# Patient Record
Sex: Female | Born: 1950 | Race: White | Hispanic: No | Marital: Married
Health system: Midwestern US, Academic
[De-identification: ages and names within clinical notes are randomized; demographics above are authoritative.]

---

## 2017-06-24 ENCOUNTER — Encounter: Admit: 2017-06-24 | Discharge: 2017-06-24 | Payer: BC Managed Care – PPO

## 2017-06-24 DIAGNOSIS — I159 Secondary hypertension, unspecified: Principal | ICD-10-CM

## 2017-06-24 DIAGNOSIS — E785 Hyperlipidemia, unspecified: ICD-10-CM

## 2017-06-24 MED ORDER — LABETALOL 100 MG PO TAB
ORAL_TABLET | Freq: Two times a day (BID) | ORAL | 0 refills | 60.00000 days | Status: AC
Start: 2017-06-24 — End: 2017-08-26

## 2017-07-05 ENCOUNTER — Encounter: Admit: 2017-07-05 | Discharge: 2017-07-05 | Payer: BC Managed Care – PPO

## 2017-07-05 DIAGNOSIS — I159 Secondary hypertension, unspecified: Principal | ICD-10-CM

## 2017-07-05 DIAGNOSIS — E785 Hyperlipidemia, unspecified: ICD-10-CM

## 2017-07-05 MED ORDER — ATORVASTATIN 20 MG PO TAB
ORAL_TABLET | Freq: Every day | 2 refills
Start: 2017-07-05 — End: ?

## 2017-07-25 ENCOUNTER — Encounter: Admit: 2017-07-25 | Discharge: 2017-07-25 | Payer: MEDICARE

## 2017-07-25 DIAGNOSIS — E785 Hyperlipidemia, unspecified: ICD-10-CM

## 2017-07-25 DIAGNOSIS — I159 Secondary hypertension, unspecified: Principal | ICD-10-CM

## 2017-07-26 MED ORDER — LABETALOL 100 MG PO TAB
ORAL_TABLET | Freq: Two times a day (BID) | 0 refills
Start: 2017-07-26 — End: ?

## 2017-08-05 ENCOUNTER — Encounter: Admit: 2017-08-05 | Discharge: 2017-08-05 | Payer: BC Managed Care – PPO

## 2017-08-05 DIAGNOSIS — I159 Secondary hypertension, unspecified: Principal | ICD-10-CM

## 2017-08-05 DIAGNOSIS — E785 Hyperlipidemia, unspecified: ICD-10-CM

## 2017-08-06 MED ORDER — ATORVASTATIN 20 MG PO TAB
ORAL_TABLET | Freq: Every day | 0 refills | Status: AC
Start: 2017-08-06 — End: 2017-08-26

## 2017-08-06 NOTE — Telephone Encounter
Pt overdue for OV and labs. Pt has an upcoming appointment. Pt agreeable to get labs. AST, ALT and Lipid panel labs sent.

## 2017-08-09 ENCOUNTER — Encounter: Admit: 2017-08-09 | Discharge: 2017-08-09 | Payer: MEDICARE

## 2017-08-09 DIAGNOSIS — E785 Hyperlipidemia, unspecified: ICD-10-CM

## 2017-08-09 DIAGNOSIS — I159 Secondary hypertension, unspecified: Principal | ICD-10-CM

## 2017-08-09 LAB — COMPREHENSIVE METABOLIC PANEL
Lab: 0.5
Lab: 16
Lab: 20
Lab: 65
Lab: 9.6

## 2017-08-09 MED ORDER — LABETALOL 100 MG PO TAB
ORAL_TABLET | Freq: Two times a day (BID) | 0 refills
Start: 2017-08-09 — End: ?

## 2017-08-10 ENCOUNTER — Encounter: Admit: 2017-08-10 | Discharge: 2017-08-10 | Payer: MEDICARE

## 2017-08-10 ENCOUNTER — Encounter: Admit: 2017-08-10 | Discharge: 2017-08-10 | Payer: BC Managed Care – PPO

## 2017-08-10 NOTE — Progress Notes
Records Request    Medical records request for continuation of care:    Patient has appointment on 08/26/17   with  Dr. Samantha Crimes* .    Please fax records to [facility]  [phone]    Request records:    Most recent hospital admission/ Emergency Room Visit    EKG's           Recent Cardiac Testing    Any cardiac-related records    Recent Labs    Procedures    H&P/Discharge Summary      Thank you,      [facility]  [facility]  [address] [place] [address]  Phone:  [phone]  Fax:  [phone]

## 2017-08-18 ENCOUNTER — Encounter: Admit: 2017-08-18 | Discharge: 2017-08-18 | Payer: BC Managed Care – PPO

## 2017-08-18 DIAGNOSIS — E785 Hyperlipidemia, unspecified: Principal | ICD-10-CM

## 2017-08-18 LAB — LIPID PROFILE
Lab: 124
Lab: 173

## 2017-08-26 ENCOUNTER — Ambulatory Visit: Admit: 2017-08-26 | Discharge: 2017-08-27 | Payer: BC Managed Care – PPO

## 2017-08-26 ENCOUNTER — Encounter: Admit: 2017-08-26 | Discharge: 2017-08-26 | Payer: MEDICARE

## 2017-08-26 DIAGNOSIS — E785 Hyperlipidemia, unspecified: ICD-10-CM

## 2017-08-26 DIAGNOSIS — I63511 Cerebral infarction due to unspecified occlusion or stenosis of right middle cerebral artery: ICD-10-CM

## 2017-08-26 DIAGNOSIS — Z9189 Other specified personal risk factors, not elsewhere classified: ICD-10-CM

## 2017-08-26 DIAGNOSIS — R51 Headache: ICD-10-CM

## 2017-08-26 DIAGNOSIS — I639 Cerebral infarction, unspecified: ICD-10-CM

## 2017-08-26 DIAGNOSIS — J45909 Unspecified asthma, uncomplicated: Principal | ICD-10-CM

## 2017-08-26 DIAGNOSIS — I10 Essential (primary) hypertension: ICD-10-CM

## 2017-08-26 DIAGNOSIS — I219 Acute myocardial infarction, unspecified: ICD-10-CM

## 2017-08-26 DIAGNOSIS — Z8249 Family history of ischemic heart disease and other diseases of the circulatory system: ICD-10-CM

## 2017-08-26 DIAGNOSIS — G43411 Hemiplegic migraine, intractable, with status migrainosus: ICD-10-CM

## 2017-08-26 DIAGNOSIS — F419 Anxiety disorder, unspecified: ICD-10-CM

## 2017-08-26 DIAGNOSIS — I159 Secondary hypertension, unspecified: Principal | ICD-10-CM

## 2017-08-26 DIAGNOSIS — E669 Obesity, unspecified: ICD-10-CM

## 2017-08-26 DIAGNOSIS — Z720 Tobacco use: ICD-10-CM

## 2017-08-26 DIAGNOSIS — Z8679 Personal history of other diseases of the circulatory system: ICD-10-CM

## 2017-08-26 MED ORDER — LABETALOL 100 MG PO TAB
100 mg | ORAL_TABLET | Freq: Two times a day (BID) | ORAL | 3 refills | 60.00000 days | Status: AC
Start: 2017-08-26 — End: 2018-08-23

## 2017-08-26 MED ORDER — ATORVASTATIN 20 MG PO TAB
20 mg | ORAL_TABLET | Freq: Every day | ORAL | 3 refills | Status: AC
Start: 2017-08-26 — End: ?

## 2017-08-26 NOTE — Progress Notes
Date of Service: 08/26/2017    Suzanne Hopkins is a 66 y.o. female.       HPI     Suzanne Hopkins is a 66 year old white female that has a history of hypertension, hyperlipidemia, risk factors for coronary artery disease, family history of premature CAD and she also has a history of CVA.  She was last seen by me in the office in February 2017.    This patient sustained a cerebrovascular accident in July 2016.  She initially presented at [facility] where she was administered intravenous TPA.  She was then transferred to [facility] for further management.    She recovered well from this event.  For a while she did have left-sided motor deficit and speech related issues but after intense physical therapy and occupational therapy patient did recover the function.    She was admitted and evaluated at [facility] in July 2018.  Patient recalls that at that time she was at an air show and she became very dehydrated.  She was administered IV fluids, her hemodynamic parameters were stable and she was discharged home in stable condition.    Patient does not report having symptoms of chest pain or heart palpitations.  She was evaluated with a myocardial perfusion imaging study on 07/22/2015, the tomographic pattern was negative for ischemia, ejection fraction was normal.    Due to a history of symptomatic heart palpitations she was also evaluated with an event monitor in December 2016, she was found to have isolated PVCs, there were no other abnormalities.         Vitals:    08/26/17 1506 08/26/17 1518   BP: 136/86 142/78   Pulse: 68    Weight: 90 kg (198 lb 6.4 oz)    Height: 1.6 m (5' 3)      Body mass index is 35.14 kg/m???.     Past Medical History  Patient Active Problem List    Diagnosis Date Noted   ??? Obesity (BMI 30.0-34.9) 10/24/2015   ??? Essential hypertension 10/24/2015   ??? History of tobacco abuse 07/09/2015   ??? Cardiovascular risk factor 07/09/2015 ??? Family history of ASCVD (arteriosclerotic cardiovascular disease) 07/09/2015   ??? Cerebral infarct (HCC) 07/01/2015     Hemiplegia, unspecified affecting left nondominate side, treated with tPA       ??? Aphasia 07/01/2015   ??? Hyperlipidemia 07/01/2015   ??? CVA (cerebral vascular accident) (HCC) 07/01/2015     Acute ischemic right MCA stroke, MRI 03/2015 no acute infarct, mild chronic small vessel ischemic disease,        ??? 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 Arterial ischemic stroke, MCA (middle cerebral artery), right, acute (HCC) 07/01/2015     angio neck 03/2015 no stenosis of arteries, scattered mild atherosclerotic plaque. Echo doppler 03/2015 EF 75 % no valvular abnormalities, CXR 03/2015 normal cardiac size, low lung volume, bibasilar heterogeneous airspace opacities correlate ateleasis/and/or edema.      ??? 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 H/O knee surgery 07/01/2015     Has history of 19 surgeries to left knee       ??? H/O: hysterectomy 07/01/2015   ??? Pulmonary nodule 06/17/2015   ??? Tobacco abuse 06/17/2015   ??? Mild intermittent asthma without complication 06/17/2015         Review of Systems  Constitution: Negative.   HENT: Negative.    Eyes: Negative.    Cardiovascular: Negative.    Respiratory: Negative.    Endocrine: Negative.    Hematologic/Lymphatic: Negative.    Skin: Negative.    Musculoskeletal: Negative.    Gastrointestinal: Negative.    Genitourinary: Negative.    Neurological: Negative.    Psychiatric/Behavioral: Negative.    Allergic/Immunologic: Negative.        Physical Exam  General Appearance: obese  Skin: warm, moist, no ulcers or xanthomas  Eyes: conjunctivae and lids normal, pupils are equal and round  Lips & Oral Mucosa: no pallor or cyanosis  Neck Veins: neck veins are flat, neck veins are not distended Chest Inspection: chest is normal in appearance  Respiratory Effort: breathing comfortably, no respiratory distress  Auscultation/Percussion: lungs clear to auscultation, no rales or rhonchi, no wheezing  Cardiac Rhythm: regular rhythm and normal rate  Cardiac Auscultation: S1, S2 normal, no rub, no gallop  Murmurs: no murmur  Carotid Arteries: normal carotid upstroke bilaterally, no bruit  Abdominal aorta: could not be examined due to obese adomen  Lower Extremity Edema: no lower extremity edema  Abdominal Exam: soft, non-tender, no masses, bowel sounds normal  Liver & Spleen: no organomegaly  Language and Memory: patient responsive and seems to comprehend information  Neurologic Exam: neurological assessment grossly intact        Cardiovascular Studies  Twelve-lead EKG demonstrates normal sinus rhythm, early R wave progression    Problems Addressed Today  Encounter Diagnoses   Name Primary?   ??? Cerebrovascular accident (CVA), unspecified mechanism (HCC) Yes   ??? Secondary hypertension    ??? Hyperlipidemia, unspecified hyperlipidemia type    ??? Arterial ischemic stroke, MCA (middle cerebral artery), right, acute (HCC)    ??? Hemiplegic migraine, with intractable migraine, so stated, with status migrainosus    ??? Essential hypertension    ??? Tobacco abuse    ??? Cardiovascular risk factor    ??? Family history of ASCVD (arteriosclerotic cardiovascular disease)    ??? Obesity (BMI 30.0-34.9)        Assessment and Plan     Assessment:    1.  History of CVA  ??? Patient was admitted at [facility] in July 2016  ??? Patient initially presented in [facility] ER, she received intravenous TPA  ??? At the time, the workup did not reveal any obvious etiology of the thrombotic stroke  ??? Patient did have left-sided motor deficit, however with physical and occupational therapy she recovered very well    2.  Hypertension  ??? Patient is on a small dose of labetalol and her blood pressure is under reasonable control 3.  Hyperlipidemia  ??? Patient is on statin therapy    4.  Family history of premature CAD  ??? Patient states that her father sustained an MI  ??? Patient has 2 sisters who underwent PCI, she also has a sister that has an ICD in place  ??? Previous evaluation with a myocardial perfusion imaging study in November 2016 did not reveal any ischemia.    Plan:    1.  Continue all current medications  2.  We did discuss about risk factors modification and in particular weight reduction  3.  Follow-up office visit in 10-12 months.         Current Medications (including today's revisions)  ??? ASPIRIN PO Take 81 mg by mouth.   ??? atorvastatin (LIPITOR) 20 mg tablet TAKE 1 TABLET BY MOUTH DAILY   ???  labetalol (NORMODYNE) 100 mg tablet TAEK 1 TABLET BY MOUTH TWICE DAILY

## 2017-09-09 ENCOUNTER — Encounter: Admit: 2017-09-09 | Discharge: 2017-09-09 | Payer: BC Managed Care – PPO

## 2018-08-23 ENCOUNTER — Encounter: Admit: 2018-08-23 | Discharge: 2018-08-23 | Payer: MEDICARE

## 2018-08-23 DIAGNOSIS — I159 Secondary hypertension, unspecified: Principal | ICD-10-CM

## 2018-08-23 DIAGNOSIS — E785 Hyperlipidemia, unspecified: ICD-10-CM

## 2018-08-23 MED ORDER — LABETALOL 100 MG PO TAB
100 mg | ORAL_TABLET | Freq: Two times a day (BID) | ORAL | 0 refills | 60.00000 days | Status: AC
Start: 2018-08-23 — End: ?

## 2018-09-22 LAB — LIPID PROFILE: Lab: 16 — ABNORMAL LOW (ref 33.0–37.0)

## 2018-09-22 LAB — COMPREHENSIVE METABOLIC PANEL
Lab: 14
Lab: 19
Lab: 32
Lab: 4.2
Lab: 7.6
Lab: 73
Lab: 75

## 2018-10-13 ENCOUNTER — Encounter: Admit: 2018-10-13 | Discharge: 2018-10-13 | Payer: MEDICARE

## 2018-10-13 DIAGNOSIS — E785 Hyperlipidemia, unspecified: ICD-10-CM

## 2018-10-13 DIAGNOSIS — I159 Secondary hypertension, unspecified: Principal | ICD-10-CM

## 2018-10-13 MED ORDER — ATORVASTATIN 20 MG PO TAB
ORAL_TABLET | Freq: Every day | 3 refills
Start: 2018-10-13 — End: ?

## 2018-11-09 ENCOUNTER — Ambulatory Visit: Admit: 2018-11-09 | Discharge: 2018-11-10 | Payer: BC Managed Care – PPO

## 2018-11-09 ENCOUNTER — Encounter: Admit: 2018-11-09 | Discharge: 2018-11-09 | Payer: MEDICARE

## 2018-11-09 DIAGNOSIS — I639 Cerebral infarction, unspecified: ICD-10-CM

## 2018-11-09 DIAGNOSIS — Z8679 Personal history of other diseases of the circulatory system: ICD-10-CM

## 2018-11-09 DIAGNOSIS — R51 Headache: ICD-10-CM

## 2018-11-09 DIAGNOSIS — I10 Essential (primary) hypertension: ICD-10-CM

## 2018-11-09 DIAGNOSIS — D489 Neoplasm of uncertain behavior, unspecified: Secondary | ICD-10-CM

## 2018-11-09 DIAGNOSIS — J45909 Unspecified asthma, uncomplicated: Principal | ICD-10-CM

## 2018-11-09 DIAGNOSIS — F419 Anxiety disorder, unspecified: ICD-10-CM

## 2018-11-09 DIAGNOSIS — E785 Hyperlipidemia, unspecified: ICD-10-CM

## 2018-11-09 DIAGNOSIS — I219 Acute myocardial infarction, unspecified: ICD-10-CM

## 2018-11-09 NOTE — Patient Instructions
Shave Biopsy Site Care    - Please bandage on for 24 hours  - May remove bandage after 24 hours and clean with gentle soap and water  - Keep covered with vaseline and a bandage until healed  - Please call us if you develop swelling, pain, redness at biopsy site

## 2018-11-10 DIAGNOSIS — D229 Melanocytic nevi, unspecified: ICD-10-CM

## 2018-11-10 DIAGNOSIS — L821 Other seborrheic keratosis: ICD-10-CM

## 2018-11-10 DIAGNOSIS — C44311 Basal cell carcinoma of skin of nose: Principal | ICD-10-CM

## 2018-11-10 DIAGNOSIS — Z87828 Personal history of other (healed) physical injury and trauma: Secondary | ICD-10-CM

## 2018-11-21 ENCOUNTER — Encounter: Admit: 2018-11-21 | Discharge: 2018-11-21 | Payer: BC Managed Care – PPO

## 2018-11-21 ENCOUNTER — Encounter: Admit: 2018-11-21 | Discharge: 2018-11-21 | Payer: MEDICARE

## 2018-11-21 DIAGNOSIS — F419 Anxiety disorder, unspecified: ICD-10-CM

## 2018-11-21 DIAGNOSIS — I219 Acute myocardial infarction, unspecified: ICD-10-CM

## 2018-11-21 DIAGNOSIS — E785 Hyperlipidemia, unspecified: ICD-10-CM

## 2018-11-21 DIAGNOSIS — I639 Cerebral infarction, unspecified: ICD-10-CM

## 2018-11-21 DIAGNOSIS — J45909 Unspecified asthma, uncomplicated: Principal | ICD-10-CM

## 2018-11-21 DIAGNOSIS — I10 Essential (primary) hypertension: ICD-10-CM

## 2018-11-21 DIAGNOSIS — R51 Headache: ICD-10-CM

## 2018-11-21 DIAGNOSIS — Z8679 Personal history of other diseases of the circulatory system: ICD-10-CM

## 2018-11-23 ENCOUNTER — Encounter: Admit: 2018-11-23 | Discharge: 2018-11-23 | Payer: MEDICARE

## 2018-11-24 ENCOUNTER — Encounter: Admit: 2018-11-24 | Discharge: 2018-11-24 | Payer: MEDICARE

## 2018-11-24 ENCOUNTER — Encounter: Admit: 2018-11-24 | Discharge: 2018-11-24 | Payer: BC Managed Care – PPO

## 2018-11-24 ENCOUNTER — Ambulatory Visit: Admit: 2018-11-24 | Discharge: 2018-11-25 | Payer: MEDICARE

## 2018-11-24 DIAGNOSIS — E785 Hyperlipidemia, unspecified: ICD-10-CM

## 2018-11-24 DIAGNOSIS — I10 Essential (primary) hypertension: ICD-10-CM

## 2018-11-24 DIAGNOSIS — I639 Cerebral infarction, unspecified: ICD-10-CM

## 2018-11-24 DIAGNOSIS — J45909 Unspecified asthma, uncomplicated: Principal | ICD-10-CM

## 2018-11-24 DIAGNOSIS — R51 Headache: ICD-10-CM

## 2018-11-24 DIAGNOSIS — Z8679 Personal history of other diseases of the circulatory system: ICD-10-CM

## 2018-11-24 DIAGNOSIS — Z9189 Other specified personal risk factors, not elsewhere classified: ICD-10-CM

## 2018-11-24 DIAGNOSIS — I219 Acute myocardial infarction, unspecified: ICD-10-CM

## 2018-11-24 DIAGNOSIS — F419 Anxiety disorder, unspecified: ICD-10-CM

## 2019-01-25 ENCOUNTER — Encounter: Admit: 2019-01-25 | Discharge: 2019-01-25 | Payer: MEDICARE

## 2019-02-16 ENCOUNTER — Encounter: Admit: 2019-02-16 | Discharge: 2019-02-16

## 2019-02-16 NOTE — Progress Notes
Mohs surgery performed by Dr. Orlean Patten on 12/27/2018 to remove basal cell carcinoma on right alar crease. Dr. Wendee Beavers office faxed procedure note from visit and this has been scanned into patient chart for future reference.  Celine Ahr, LPN

## 2019-03-15 ENCOUNTER — Encounter: Admit: 2019-03-15 | Discharge: 2019-03-15

## 2019-03-15 DIAGNOSIS — C4491 Basal cell carcinoma of skin, unspecified: Secondary | ICD-10-CM

## 2019-03-15 DIAGNOSIS — F419 Anxiety disorder, unspecified: Secondary | ICD-10-CM

## 2019-03-15 DIAGNOSIS — D229 Melanocytic nevi, unspecified: Secondary | ICD-10-CM

## 2019-03-15 DIAGNOSIS — Z8679 Personal history of other diseases of the circulatory system: Secondary | ICD-10-CM

## 2019-03-15 DIAGNOSIS — Z85828 Personal history of other malignant neoplasm of skin: Principal | ICD-10-CM

## 2019-03-15 DIAGNOSIS — J45909 Unspecified asthma, uncomplicated: Secondary | ICD-10-CM

## 2019-03-15 DIAGNOSIS — R51 Headache: Secondary | ICD-10-CM

## 2019-03-15 DIAGNOSIS — I219 Acute myocardial infarction, unspecified: Secondary | ICD-10-CM

## 2019-03-15 DIAGNOSIS — E785 Hyperlipidemia, unspecified: Secondary | ICD-10-CM

## 2019-03-15 DIAGNOSIS — I639 Cerebral infarction, unspecified: Secondary | ICD-10-CM

## 2019-03-15 DIAGNOSIS — Z87828 Personal history of other (healed) physical injury and trauma: Secondary | ICD-10-CM

## 2019-03-15 DIAGNOSIS — I10 Essential (primary) hypertension: Secondary | ICD-10-CM

## 2019-03-15 NOTE — Progress Notes
ATTESTATION    I personally performed the key portions of the E/M visit, discussed case with resident and concur with resident documentation of history, physical exam, assessment, and treatment plan unless otherwise noted.    Staff name:  Joseph Johns Wang-Weinman, MD Date:  03/15/2019

## 2019-03-16 ENCOUNTER — Ambulatory Visit: Admit: 2019-03-15 | Discharge: 2019-03-16

## 2019-03-16 DIAGNOSIS — L821 Other seborrheic keratosis: Secondary | ICD-10-CM

## 2019-04-04 ENCOUNTER — Ambulatory Visit: Admit: 2019-04-04 | Discharge: 2019-04-05

## 2019-04-04 ENCOUNTER — Encounter: Admit: 2019-04-04 | Discharge: 2019-04-04

## 2019-04-04 DIAGNOSIS — C4491 Basal cell carcinoma of skin, unspecified: Secondary | ICD-10-CM

## 2019-04-04 DIAGNOSIS — I639 Cerebral infarction, unspecified: Secondary | ICD-10-CM

## 2019-04-04 DIAGNOSIS — Z8679 Personal history of other diseases of the circulatory system: Secondary | ICD-10-CM

## 2019-04-04 DIAGNOSIS — R51 Headache: Secondary | ICD-10-CM

## 2019-04-04 DIAGNOSIS — F419 Anxiety disorder, unspecified: Secondary | ICD-10-CM

## 2019-04-04 DIAGNOSIS — I10 Essential (primary) hypertension: Secondary | ICD-10-CM

## 2019-04-04 DIAGNOSIS — E785 Hyperlipidemia, unspecified: Secondary | ICD-10-CM

## 2019-04-04 DIAGNOSIS — J45909 Unspecified asthma, uncomplicated: Secondary | ICD-10-CM

## 2019-04-04 DIAGNOSIS — I219 Acute myocardial infarction, unspecified: Secondary | ICD-10-CM

## 2019-04-04 NOTE — Progress Notes
Date of Service: 04/04/2019    Suzanne Hopkins is a 68 y.o. female.       HPI     I saw [name] today.  She is doing well after her knee replacement surgery.  She is staying very isolated.  She gets her groceries delivered to her car from [place].  She is really do an excellent job of isolation.  She is not having fevers, chills, or sweats.  No orthopnea, PND, edema, palpitations, lightheadedness, presyncope, or chest pain.    ([id number])             Vitals:    04/04/19 0947 04/04/19 0955   BP: 124/80 120/74   BP Source: Arm, Left Upper Arm, Right Upper   Pulse: 69    Temp: 36.3 ???C (97.3 ???F)    SpO2: 97%    Weight: 90.3 kg (199 lb)    Height: 1.613 m (5' 3.5)    PainSc: Zero      Body mass index is 34.7 kg/m???.     Past Medical History  Patient Active Problem List    Diagnosis Date Noted   ??? Obesity (BMI 30.0-34.9) 10/24/2015   ??? Essential hypertension 10/24/2015   ??? History of tobacco abuse 07/09/2015   ??? Cardiovascular risk factor 07/09/2015   ??? Family history of ASCVD (arteriosclerotic cardiovascular disease) 07/09/2015   ??? Cerebral infarct (HCC) 07/01/2015     Hemiplegia, unspecified affecting left nondominate side, treated with tPA       ??? Aphasia 07/01/2015   ??? Hyperlipidemia 07/01/2015   ??? CVA (cerebral vascular accident) (HCC) 07/01/2015     Acute ischemic right MCA stroke, MRI 03/2015 no acute infarct, mild chronic small vessel ischemic disease,        ??? 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 Arterial ischemic stroke, MCA (middle cerebral artery), right, acute (HCC) 07/01/2015     angio neck 03/2015 no stenosis of arteries, scattered mild atherosclerotic plaque. Echo doppler 03/2015 EF 75 % no valvular abnormalities, CXR 03/2015 normal cardiac size, low lung volume, bibasilar heterogeneous airspace opacities correlate ateleasis/and/or edema.      ??? 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 H/O knee surgery 07/01/2015     Has history of 19 surgeries to left knee       ??? H/O: hysterectomy 07/01/2015   ??? Pulmonary nodule 06/17/2015   ??? Tobacco abuse 06/17/2015   ??? Mild intermittent asthma without complication 06/17/2015         Review of Systems   Constitution: Negative.   HENT: Negative.    Eyes: Negative.    Cardiovascular: Negative.    Respiratory: Negative.    Endocrine: Negative.    Hematologic/Lymphatic: Negative.    Skin: Positive for skin cancer (nose).   Musculoskeletal: Positive for arthritis.   Gastrointestinal: Negative.    Genitourinary: Negative.    Neurological: Negative.    Psychiatric/Behavioral: Negative.    Allergic/Immunologic: Negative.        Physical Exam  Examination reveals clear lung fields.  Regular heart rate.  No carotid bruits.  No peripheral edema, cyanosis, clubbing.  She has mild keloid formation on her right knee surgery, which appears to be healing nicely.    ([id number])        Cardiovascular Studies  Not applicable.    ([id number])  Problems Addressed Today  No diagnosis found.    Assessment and Plan     In summary, this is a 68 year old lady with hyperlipidemia, doing well from a cardiovascular standpoint.  Blood pressure is well controlled.  I have not made any changes.  We had a long talk today about COVID precautions.  We will see her back in 6 months.    ([id number])             Current Medications (including today's revisions)  ??? aspirin EC 81 mg tablet Take 1 tablet by mouth daily.   ??? atorvastatin (LIPITOR) 20 mg tablet Take one tablet by mouth daily.   ??? labetalol (NORMODYNE) 100 mg tablet Take one tablet by mouth twice daily.

## 2019-09-29 ENCOUNTER — Encounter: Admit: 2019-09-29 | Discharge: 2019-09-29 | Payer: BC Managed Care – PPO

## 2019-10-07 ENCOUNTER — Encounter: Admit: 2019-10-07 | Discharge: 2019-10-07 | Payer: BC Managed Care – PPO

## 2020-06-25 ENCOUNTER — Encounter: Admit: 2020-06-25 | Discharge: 2020-06-25 | Payer: BC Managed Care – PPO

## 2020-06-25 DIAGNOSIS — I10 Essential (primary) hypertension: Secondary | ICD-10-CM

## 2020-06-25 DIAGNOSIS — C4491 Basal cell carcinoma of skin, unspecified: Secondary | ICD-10-CM

## 2020-06-25 DIAGNOSIS — F419 Anxiety disorder, unspecified: Secondary | ICD-10-CM

## 2020-06-25 DIAGNOSIS — J45909 Unspecified asthma, uncomplicated: Secondary | ICD-10-CM

## 2020-06-25 DIAGNOSIS — R519 Head ache: Secondary | ICD-10-CM

## 2020-06-25 DIAGNOSIS — E785 Hyperlipidemia, unspecified: Secondary | ICD-10-CM

## 2020-06-25 DIAGNOSIS — I639 Cerebral infarction, unspecified: Secondary | ICD-10-CM

## 2020-06-25 DIAGNOSIS — Z8679 Personal history of other diseases of the circulatory system: Secondary | ICD-10-CM

## 2020-06-25 DIAGNOSIS — I219 Acute myocardial infarction, unspecified: Secondary | ICD-10-CM

## 2021-01-20 ENCOUNTER — Encounter: Admit: 2021-01-20 | Discharge: 2021-01-20 | Payer: BC Managed Care – PPO

## 2022-09-05 ENCOUNTER — Encounter: Admit: 2022-09-05 | Discharge: 2022-09-05 | Payer: BC Managed Care – PPO

## 2022-09-07 ENCOUNTER — Encounter: Admit: 2022-09-07 | Discharge: 2022-09-07 | Payer: MEDICARE

## 2022-09-07 ENCOUNTER — Observation Stay: Admit: 2022-09-07 | Discharge: 2022-09-07 | Payer: MEDICARE

## 2022-09-07 DIAGNOSIS — R519 Head ache: Secondary | ICD-10-CM

## 2022-09-07 DIAGNOSIS — I10 Essential (primary) hypertension: Secondary | ICD-10-CM

## 2022-09-07 DIAGNOSIS — C4491 Basal cell carcinoma of skin, unspecified: Secondary | ICD-10-CM

## 2022-09-07 DIAGNOSIS — J45909 Unspecified asthma, uncomplicated: Secondary | ICD-10-CM

## 2022-09-07 DIAGNOSIS — I219 Acute myocardial infarction, unspecified: Secondary | ICD-10-CM

## 2022-09-07 DIAGNOSIS — F419 Anxiety disorder, unspecified: Secondary | ICD-10-CM

## 2022-09-07 DIAGNOSIS — Z8679 Personal history of other diseases of the circulatory system: Secondary | ICD-10-CM

## 2022-09-07 DIAGNOSIS — E785 Hyperlipidemia, unspecified: Secondary | ICD-10-CM

## 2022-09-07 DIAGNOSIS — R079 Chest pain, unspecified: Secondary | ICD-10-CM

## 2022-09-07 DIAGNOSIS — I639 Cerebral infarction, unspecified: Secondary | ICD-10-CM

## 2022-09-07 LAB — CBC AND DIFF
ABSOLUTE BASO COUNT: 0.1 K/UL (ref 0–0.20)
ABSOLUTE EOS COUNT: 0 K/UL (ref 0–0.45)
WBC COUNT: 9.5 K/UL (ref 4.5–11.0)

## 2022-09-07 LAB — COMPREHENSIVE METABOLIC PANEL
ALBUMIN: 3.7 g/dL (ref 3.5–5.0)
ALK PHOSPHATASE: 54 U/L (ref 25–110)
ALT: 27 U/L (ref 7–56)
ANION GAP: 8 K/UL (ref 3–12)
AST: 21 U/L (ref 7–40)
CO2: 27 MMOL/L (ref 21–30)
EGFR: >60 mL/min — ABNORMAL HIGH (ref >60)
SODIUM: 142 MMOL/L — ABNORMAL LOW (ref 137–147)
TOTAL BILIRUBIN: 0.6 mg/dL (ref 0.3–1.2)

## 2022-09-07 LAB — HEMOGLOBIN A1C: HEMOGLOBIN A1C: 5.7 % — ABNORMAL LOW (ref 4.0–5.7)

## 2022-09-07 LAB — BASIC METABOLIC PANEL
ANION GAP: 6 (ref 3–12)
BLD UREA NITROGEN: 16 mg/dL (ref 7–25)
CALCIUM: 9 mg/dL (ref 8.5–10.6)
CHLORIDE: 102 MMOL/L (ref 98–110)
CO2: 30 MMOL/L (ref 21–30)
CREATININE: 0.8 mg/dL (ref 0.4–1.00)
EGFR: >60 mL/min (ref >60)
GLUCOSE,PANEL: 104 mg/dL — ABNORMAL HIGH (ref 70–100)
POTASSIUM: 4.1 MMOL/L (ref 3.5–5.1)
SODIUM: 138 MMOL/L (ref 137–147)

## 2022-09-07 LAB — CBC
HEMATOCRIT: 35 % — ABNORMAL LOW (ref 36–45)
MCH: 30 pg (ref 26–34)
MCHC: 33 g/dL (ref 32.0–36.0)
MCV: 89 FL (ref 80–100)
PLATELET COUNT: 250 K/UL (ref 150–400)
RDW: 13 % (ref 11–15)
WBC COUNT: 9 K/UL (ref 4.5–11.0)

## 2022-09-07 LAB — MAGNESIUM
MAGNESIUM: 1.9 mg/dL (ref 1.6–2.6)
MAGNESIUM: 2 mg/dL — ABNORMAL LOW (ref 1.6–2.6)

## 2022-09-07 LAB — HIGH SENSITIVITY TROPONIN I 4 HR: HI SEN TNI 4 HR: 5 ng/L (ref <12)

## 2022-09-07 LAB — HIGH SENSITIVITY TROPONIN I 0 HOUR: HIGH SENSITIVITY TROPONIN I 0 HOUR: 5 ng/L (ref <12)

## 2022-09-07 LAB — PHOSPHORUS: PHOSPHORUS: 3.9 mg/dL (ref 2.0–4.5)

## 2022-09-07 LAB — LIPID PROFILE
CHOLESTEROL: 158 mg/dL (ref <200)
TRIGLYCERIDES: 113 mg/dL (ref <150)

## 2022-09-07 LAB — PROTIME INR (PT): PROTIME: 11 s — ABNORMAL LOW (ref 9.5–14.2)

## 2022-09-07 LAB — HIGH SENSITIVITY TROPONIN I 2 HOUR: HIGH SENSITIVITY TROPONIN I 2 HOUR: 5 ng/L (ref <12)

## 2022-09-07 MED ORDER — ALBUTEROL SULFATE 90 MCG/ACTUATION IN HFAA
2 | RESPIRATORY_TRACT | 0 refills | Status: AC | PRN
Start: 2022-09-07 — End: ?
  Administered 2022-09-08: 01:00:00 2 via RESPIRATORY_TRACT

## 2022-09-07 MED ORDER — ONDANSETRON 4 MG PO TBDI
4 mg | ORAL | 0 refills | Status: AC | PRN
Start: 2022-09-07 — End: ?

## 2022-09-07 MED ORDER — HYDROXYZINE HCL 25 MG PO TAB
25 mg | Freq: Once | ORAL | 0 refills | Status: CP
Start: 2022-09-07 — End: ?
  Administered 2022-09-08: 04:00:00 25 mg via ORAL

## 2022-09-07 MED ORDER — MAGNESIUM SULFATE IN D5W 1 GRAM/100 ML IV PGBK
1 g | INTRAVENOUS | 0 refills | Status: CP
Start: 2022-09-07 — End: ?
  Administered 2022-09-07: 19:00:00 1 g via INTRAVENOUS

## 2022-09-07 MED ORDER — SENNOSIDES-DOCUSATE SODIUM 8.6-50 MG PO TAB
1 | Freq: Every day | ORAL | 0 refills | Status: AC | PRN
Start: 2022-09-07 — End: ?

## 2022-09-07 MED ORDER — FUROSEMIDE 10 MG/ML IJ SOLN
40 mg | Freq: Two times a day (BID) | INTRAVENOUS | 0 refills | Status: AC
Start: 2022-09-07 — End: ?
  Administered 2022-09-07 – 2022-09-08 (×4): 40 mg via INTRAVENOUS

## 2022-09-07 MED ORDER — ENOXAPARIN 40 MG/0.4 ML SC SYRG
40 mg | Freq: Two times a day (BID) | SUBCUTANEOUS | 0 refills | Status: AC
Start: 2022-09-07 — End: ?
  Administered 2022-09-07 – 2022-09-10 (×4): 40 mg via SUBCUTANEOUS

## 2022-09-07 MED ORDER — ACETAMINOPHEN 325 MG PO TAB
650 mg | ORAL | 0 refills | Status: AC | PRN
Start: 2022-09-07 — End: ?
  Administered 2022-09-07 – 2022-09-09 (×2): 650 mg via ORAL

## 2022-09-07 MED ORDER — ASPIRIN 81 MG PO TBEC
81 mg | Freq: Every day | ORAL | 0 refills | Status: AC
Start: 2022-09-07 — End: ?
  Administered 2022-09-07 – 2022-09-10 (×4): 81 mg via ORAL

## 2022-09-07 MED ORDER — POTASSIUM CHLORIDE 20 MEQ PO TBTQ
40 meq | Freq: Once | ORAL | 0 refills | Status: CP
Start: 2022-09-07 — End: ?
  Administered 2022-09-07: 17:00:00 40 meq via ORAL

## 2022-09-07 MED ORDER — POLYETHYLENE GLYCOL 3350 17 GRAM PO PWPK
1 | Freq: Every day | ORAL | 0 refills | Status: AC | PRN
Start: 2022-09-07 — End: ?

## 2022-09-07 MED ORDER — SODIUM CHLORIDE 0.65 % NA SPRA
2 | NASAL | 0 refills | Status: AC | PRN
Start: 2022-09-07 — End: ?

## 2022-09-07 MED ORDER — LABETALOL 100 MG PO TAB
100 mg | Freq: Two times a day (BID) | ORAL | 0 refills | Status: AC
Start: 2022-09-07 — End: ?
  Administered 2022-09-07 – 2022-09-09 (×3): 100 mg via ORAL

## 2022-09-07 MED ORDER — ENOXAPARIN 40 MG/0.4 ML SC SYRG
40 mg | Freq: Every day | SUBCUTANEOUS | 0 refills | Status: DC
Start: 2022-09-07 — End: 2022-09-07

## 2022-09-07 MED ORDER — MELATONIN 5 MG PO TAB
5 mg | Freq: Every evening | ORAL | 0 refills | Status: AC | PRN
Start: 2022-09-07 — End: ?

## 2022-09-07 MED ORDER — ATORVASTATIN 40 MG PO TAB
40 mg | Freq: Every day | ORAL | 0 refills | Status: AC
Start: 2022-09-07 — End: ?
  Administered 2022-09-07 – 2022-09-10 (×4): 40 mg via ORAL

## 2022-09-07 MED ORDER — ONDANSETRON HCL (PF) 4 MG/2 ML IJ SOLN
4 mg | INTRAVENOUS | 0 refills | Status: AC | PRN
Start: 2022-09-07 — End: ?

## 2022-09-07 MED ORDER — METHOCARBAMOL 500 MG PO TAB
500 mg | Freq: Once | ORAL | 0 refills | Status: CP
Start: 2022-09-07 — End: ?
  Administered 2022-09-07: 23:00:00 500 mg via ORAL

## 2022-09-07 NOTE — Care Coordination-Inpatient
Med Private Night 5 [phone] Addison Bailey (available on Voalte) will take calls on this patient until 8 am on 09/07/2022. Afterwards, please contact Med-Private F team for any questions or concerns. Voalte is the preferred way of communication.     Gale Journey M.D.  Night AOD  "On Voalte ---- 8 pm to 8 am"    .

## 2022-09-07 NOTE — H&P (View-Only)
Admission History and Physical Examination      Name: Suzanne Hopkins   MRN: [id number]     DOB: 12/10/1950      Age: 72 y.o.  Admission Date: 09/07/2022     LOS: 0 days     Date of Service: 09/07/2022                        Assessment/Plan:    Principal Problem:    Chest pain  Active Problems:    Hyperlipidemia    Essential hypertension    Sheron Kunin is a 72 yo F with a PMHx of HTN, HLD, and Hx of CVA who is transferred from outside hospital for further workup of ongoing chest pain and dyspnea    Acute heart failure, systolic vs diastolic  - Transfer note mentions patient was diagnosed with CHF on 12/20 and sent home with diuretics? Patient reports she has been on diuretics for possibly a few months. She reports worsening dyspnea, PND, orthopnea, weight gain, worsening LE edema  Plan:  > Obtain echo  > Start IV lasix 40 mg BID    Chest pain  - Patient reports history of MI? Although some details she gives sounds more like pericarditis. Stress test in 2016 was normal.  - EKG on admission with sinus bradycardia but no T wave inversions or ST segment elevations  - Outside trops negative  - Recent ER visits for recurrent CP, dyspnea noted normal CTA, normal CXR  Plan:  > Obtain Reg/Thall stress test  > Trend trops  > Obtain A1c, lipid panel    Hx CVA  > Continue PTA aspirin and statin    HTN  HLD  > Continue PTA labetalol 100 mg BID and atorvastatin 40 mg  > Obtain lipid panel      FEN: No IVF, replace lytes PRN, NPO  Ppx: Lovenox  Code status: FULL  Dispo: Admit to inpatient    Leonia Reeves MD  Internal Medicine, Hospitalist    Voalte is the preferred method of communication.   Please use the Med Private First Call for all patient-related communications. Personal Voaltes and pagers are not answered at all hours.  _____________________________________________________________________________    Primary Care Physician: Wilford Grist  Verified    Chief Complaint:  I can't breath  History of Present Illness: Suzanne Hopkins is a 72 y.o. female with a PMHx of HTN, HLD, and Hx of CVA who is transferred from outside hospital for further workup of ongoing chest pain and dyspnea.  Patient reports history of stroke and subsequent issues with memory.  She denies any physical deficits from her stroke.  She states her symptoms started rather suddenly about 11 days ago.  She has been having episodes of dyspnea and chest pain over this time.  She denies any inciting events in the last few weeks but could have brought any of this on.  She reports several episodes of sudden onset dyspnea which caused her to feel very anxious.  She reports most of these seem to happen at night where she suddenly wake up short of breath.  She notably reports 1 episode while taking a shower and washing her hair.  She also reports orthopnea in addition to lower extremity swelling and weight gain.  She gives different values for her weight gain, citing 8 pounds in the past 11 days versus 11 pounds the last 48 hours.  She also reports chest pain that is centrally located.  She does report that the pain will radiate to her left scapula.  It is associated with dyspnea and nausea at times.  Does not seem to be brought on by exertion although patient later states that doing some exertional things will bring it on.  Over the last 48 hours it has been rather constant in nature with flareups during that time.  Currently she is chest pain-free after the interventions taken at the outside emergency room.  She reports she quit smoking in early November.  She does report that she is taking furosemide.  She thinks she has been taking it for the past few months then later stating that her dose was increased a few days ago after an ER visit.    Medical History:   Diagnosis Date    Anxiety     Asthma     Basal cell carcinoma     CVA (cerebral vascular accident) (HCC)     Dyslipidemia     H/O angina pectoris     Head ache Hypertension     MI (myocardial infarction) (HCC)      Surgical History:   Procedure Laterality Date    HX CHOLECYSTECTOMY      HYSTERECTOMY      OOPHORECTOMY      TONSILLECTOMY      TUBAL LIGATION      VENTRAL HERNIA REPAIR       Family History   Problem Relation Age of Onset    Cancer-Ovarian Sister     Cancer-Lung Father     COPD Father     Coronary Artery Disease Father     Coronary Artery Disease Mother     Melanoma Neg Hx      Social History     Tobacco Use    Smoking status: Former    Smokeless tobacco: Never   Substance and Sexual Activity    Alcohol use: Yes     Alcohol/week: 25.0 standard drinks of alcohol     Types: 25 Standard drinks or equivalent per week    Drug use: No             Immunizations (includes history and patient reported):   Immunization History   Administered Date(s) Administered    Flu Vaccine Quadrivalent =>3 Yo (Preservative Free) 06/17/2015           Allergies:  Penicillins, Cefdinir, Pentazocine, and Pentazocine    Medications:  Current Facility-Administered Medications   Medication    acetaminophen (TYLENOL) tablet 650 mg    aspirin EC (ASPIR-LOW) tablet 81 mg    atorvastatin (LIPITOR) tablet 40 mg    enoxaparin (LOVENOX) syringe 40 mg    furosemide (LASIX) injection 40 mg    labetaloL (NORMODYNE) tablet 100 mg    melatonin tablet 5 mg    ondansetron (ZOFRAN ODT) rapid dissolve tablet 4 mg    Or    ondansetron HCL (PF) (ZOFRAN (PF)) injection 4 mg    polyethylene glycol 3350 (MIRALAX) packet 17 g    sennosides-docusate sodium (SENOKOT-S) tablet 1 tablet     Review of Systems:  A 14 point review of systems was negative except for: weight gain, LE edema, chest pain, nausea, dyspnea, orthopnea, PND    Physical Exam:  Vital Signs: Last Filed In 24 Hours Vital Signs: 24 Hour Range   BP: 157/88 (01/01 0253)  Temp: 36.6 ?C (97.8 ?F) (01/01 0253)  Pulse: 65 (01/01 0253)  SpO2: 96 % (01/01 0253)  O2 Device: None (Room air) (  01/01 0253)  Height: 161.3 cm (5' 3.5) (01/01 0357) BP: (157)/(88) Temp:  [36.6 ?C (97.8 ?F)]   Pulse:  [65]   SpO2:  [96 %]   O2 Device: None (Room air)          General appearance:  alert, well-developed, well-nourished, cooperative, no distress, and morbidly obese  Head:  Normocephalic, without obvious abnormality, atraumatic  Eyes:  negative findings: conjunctivae and sclerae normal and corneas clear  Neck:  supple, symmetrical, trachea midline and JVD with +HJR  Lungs:  clear to auscultation bilaterally  Heart:  regular rate and rhythm, S1, S2 normal, no murmur, click, rub or gallop  Abdomen:  soft, non-tender. Bowel sounds normal. No masses,  no organomegaly  Extremities:  no ulcers, gangrene or trophic changes, edema 2+ pitting bilateral lower extremities to the knee  Neurologic:  Grossly normal  Peripheral pulses:  2+ and symmetric  Skin:  Skin color, texture, turgor normal. No rashes or lesions    Lab/Radiology/Other Diagnostic Tests:  24-hour labs:    Results for orders placed or performed during the hospital encounter of 09/07/22 (from the past 24 hour(s))   CBC AND DIFF    Collection Time: 09/07/22  4:04 AM   Result Value Ref Range    White Blood Cells 9.5 4.5 - 11.0 K/UL    RBC 3.79 (L) 4.0 - 5.0 M/UL    Hemoglobin 11.5 (L) 12.0 - 15.0 GM/DL    Hematocrit 16.1 (L) 36 - 45 %    MCV 87.7 80 - 100 FL    MCH 30.2 26 - 34 PG    MCHC 34.4 32.0 - 36.0 G/DL    RDW 09.6 11 - 15 %    Platelet Count 240 150 - 400 K/UL    MPV 9.4 7 - 11 FL    Neutrophils 50 41 - 77 %    Lymphocytes 39 24 - 44 %    Monocytes 9 4 - 12 %    Eosinophils 1 0 - 5 %    Basophils 1 0 - 2 %    Absolute Neutrophil Count 4.78 1.8 - 7.0 K/UL    Absolute Lymph Count 3.71 1.0 - 4.8 K/UL    Absolute Monocyte Count 0.86 (H) 0 - 0.80 K/UL    Absolute Eosinophil Count 0.09 0 - 0.45 K/UL    Absolute Basophil Count 0.10 0 - 0.20 K/UL        Pertinent radiology reviewed., EKG Reviewed    Raj Janus, MD

## 2022-09-07 NOTE — Progress Notes
RT Adult Assessment Note    NAME:Suzanne Hopkins             MRN: [id number]             DOB:1951-01-26          AGE: 72 y.o.  ADMISSION DATE: 09/07/2022             DAYS ADMITTED: LOS: 0 days    Additional Comments:  Impressions of the patient: AA&O, NAD  Intervention(s)/outcome(s): n/a  Patient education that was completed: n/a  Recommendations to the care team: none    Vital Signs:  Pulse: 72  RR: 15 PER MINUTE  SpO2: 98 %  O2 Device: None (Room air)  Liter Flow:    O2%:      Breath Sounds:   Breath Sounds WDL: Within Defined Limits  Respiratory Effort:   Respiratory WDL: Within Defined Limits  Comments:

## 2022-09-07 NOTE — Consults
CLINICAL NUTRITION                                                        Clinical Nutrition Initial Assessment    Name: Suzanne Hopkins   MRN: [id number]     DOB: Dec 25, 1950      Age: 72 y.o.  Admission Date: 09/07/2022     LOS: 0 days     Date of Service: 09/07/2022      Recommendation:  Continue Cardiac diet. Encourage 3 meals/day with 1-2 protein sources per meal.     Comments:  72 yo F with a PMHx of HTN, HLD, and Hx of CVA who is transferred from outside hospital for further workup of ongoing chest pain and dyspnea. Admitted with acute heart failure.     Clinical nutrition consulted for nutrition assessment. Spoke with patient at bedside who reports a fair appetite at this time. Reports a usual intake of 2 meals per day - breakfast of oatmeal and coffee, dinner of a protein food (usually meat), starch such as bread or rice, and vegetable such as cauliflower or broccoli. Salts food while cooking but does not use a salt shaker. States she has had the same box of salt for the past 7 years! Patient has changed diet over the past 6 months or so to work on weight loss and had lost 6 lbs during this time prior to fluid/weight gain from HF.     Patient is not at acute nutrition risk at this time but could benefit from further diet education. Will follow up this week for further nutrition needs/education on resources for weight loss.    Nutrition Assessment of Patient:  Admit Weight: 114.1 kg;   Pertinent Allergies/Intolerances: None noted  Pertinent Labs: Labs reviewed, WNL; Pertinent Meds: Atorvastatin, lasix, zofran/bowel regimen PRN;    Oral Diet Order: Cardiac;       Current Oral Intake: Adequate    Estimated Calorie Needs: 1620-1945 kcal/day (25-30 kcal/kg DBW)  Estimated Protein Needs: 65-84 g/day (1.0-1.3 g/kg DBW)    Malnutrition Assessment:   Adequately nourished prior to admission       Nutrition Focused Physical Assessment:   Edema: Yes; Severity: Moderate; Location: Lower extremities     Pressure Injury: None noted     Comment: LBM 12/31 PTA    Intervention / Plan:  Educated patient on heart failure diet. Education on Low Sodium Diet was provided.  Topics included the following:   Indications for diet  Sodium limit of 2,000 milligrams daily  Foods recommended/not recommended  Sodium content of foods (examples)  Reading food labels  Food preparation suggestions  Sodium-free seasonings  Sample meals and snacks  Eating out tips  Importance of weighing self daily  Written materials were provided.      Inda Merlin, MMN, RDN, LD  Available on Voalte

## 2022-09-07 NOTE — Progress Notes
Chest pain and SOA x 2 weeks.  To ER x 4 in that time.    Chest pain description: not reproducible, comes on at random - happens throughout the day - not associated with exertion. Center of chest, no radiation, no N/V, no diaphoresis. Describes like a spasm.  States when she calms herself down symptoms improves.    ED visits in past 10 days: 12/20 -> dx CHF - Lasix given and sent home; 12/26 -> dx COPD - Steroids and abx and sent home; States continued CP and SOA    Reports 7/10 chest pain reported..    Provider states patient has a high level of anxiety    VS:  135/61-70-12-97.5; O2 Sat 97%.    EKG - NSR without ischemic changes    CTA done last week - no PE    Labs:   Trop 1 -> less than 0.01  Trop 2 -> 0.021    Lactate 3.4    WBC 11.15  Hgb 12.2  Hct 36.3  Plts 264  NA 138  K 4.1  Cl 105  CO2 22  BUN 16  Cr 0.88  Glu 98  LE WNL    BNP 133  Lipase 32    UA WNL  CXR - WNL, no acute findings    1 liter IVF r/t elevated lactate; baby ASA x 3 (took one already)  Morphine 2 mg - pain resolved.  Hydroxizine;    Reason for transfer: Heart score is "5" - age, obesity, smoker, HTN, HLD - states MI in past but not in medical records- so multiple cardiac risk factors; no cardiology at sending tomorrow, multiple ER visits probably deserving of more extensive w/u due to risk factors.

## 2022-09-08 ENCOUNTER — Observation Stay: Admit: 2022-09-08 | Discharge: 2022-09-08 | Payer: MEDICARE

## 2022-09-08 MED ADMIN — POTASSIUM CHLORIDE 20 MEQ PO TBTQ [35943]: 40 meq | ORAL | @ 19:00:00 | Stop: 2022-09-08 | NDC 00832532511

## 2022-09-08 MED ADMIN — RP DX TC-99M TETROFOSMIN MCI [210516]: 25.7 | INTRAVENOUS | @ 18:00:00 | Stop: 2022-09-08 | NDC 54029389609

## 2022-09-08 MED ADMIN — SODIUM CHLORIDE 0.9 % IJ SOLN [7319]: 10 mL | INTRAVENOUS | @ 16:00:00 | Stop: 2022-09-08 | NDC 00409488820

## 2022-09-08 MED ADMIN — AMINOPHYLLINE 25 MG/ML IV SOLN [88540]: 50 mg | INTRAVENOUS | @ 17:00:00 | Stop: 2022-09-09 | NDC 00409592116

## 2022-09-08 MED ADMIN — REGADENOSON 0.4 MG/5 ML IV SYRG [168865]: 0.4 mg | INTRAVENOUS | @ 17:00:00 | Stop: 2022-09-08 | NDC 00469650189

## 2022-09-08 MED ADMIN — RP DX TC-99M TETROFOSMIN MCI [210516]: 8.5 | INTRAVENOUS | @ 17:00:00 | Stop: 2022-09-08 | NDC 54029389609

## 2022-09-08 MED ADMIN — PERFLUTREN LIPID MICROSPHERES 1.1 MG/ML IV SUSP [79178]: 4.5 mL | INTRAVENOUS | @ 16:00:00 | Stop: 2022-09-08 | NDC 11994001116

## 2022-09-09 ENCOUNTER — Encounter: Admit: 2022-09-09 | Discharge: 2022-09-09 | Payer: MEDICARE

## 2022-09-09 MED ADMIN — METHOCARBAMOL 500 MG PO TAB [4971]: 750 mg | ORAL | @ 01:00:00 | Stop: 2022-09-09 | NDC 00904705761

## 2022-09-09 MED ADMIN — FUROSEMIDE 40 MG PO TAB [3295]: 40 mg | ORAL | @ 13:00:00 | NDC 51079007301

## 2022-09-09 MED ADMIN — METOPROLOL TARTRATE 50 MG PO TAB [5009]: 50 mg | ORAL | @ 14:00:00 | NDC 62584026611

## 2022-09-09 MED ADMIN — ASPIRIN 81 MG PO CHEW [680]: 243 mg | ORAL | @ 20:00:00 | Stop: 2022-09-09 | NDC 00904679480

## 2022-09-09 MED ADMIN — POTASSIUM CHLORIDE 20 MEQ PO TBTQ [35943]: 40 meq | ORAL | @ 16:00:00 | Stop: 2022-09-09 | NDC 00832532511

## 2022-09-10 MED ADMIN — LOSARTAN 25 MG PO TAB [80886]: 25 mg | ORAL | @ 15:00:00 | Stop: 2022-09-10 | NDC 00904704761

## 2022-09-10 MED ADMIN — METOPROLOL TARTRATE 50 MG PO TAB [5009]: 50 mg | ORAL | @ 15:00:00 | Stop: 2022-09-10 | NDC 62584026611

## 2022-09-10 MED ADMIN — FUROSEMIDE 40 MG PO TAB [3295]: 40 mg | ORAL | @ 12:00:00 | Stop: 2022-09-10 | NDC 51079007301

## 2022-09-10 MED ADMIN — METOPROLOL TARTRATE 25 MG PO TAB [37637]: 50 mg | ORAL | @ 02:00:00 | NDC 62332011231

## 2022-09-10 MED ADMIN — POTASSIUM CHLORIDE 20 MEQ PO TBTQ [35943]: 40 meq | ORAL | @ 15:00:00 | Stop: 2022-09-10 | NDC 00832532511

## 2022-09-11 ENCOUNTER — Encounter: Admit: 2022-09-11 | Discharge: 2022-09-11 | Payer: MEDICARE

## 2022-09-11 NOTE — Telephone Encounter
Patient Discharge Date from hospital: 09/10/22  Date Call Attempted: 09/11/22  Number of Attempts: 1  Date Call Completed:      Call placed to pt for 72 hour post hospital follow up call. LM on authorized VM requesting cb. MyChart message sent.     Two Patient Identifier complete: Yes []     Next Appointment    Next follow-up appointment on 09/16/21 at 1300 with Fara Boros, APRN was canceled by patient.  Next OV is on 09/29/2022 at 0900 with Dr. Geronimo Boot in our [place] clinic    Transportation    Does pt have transportation?  Yes []     No []    NA []      Home Health        Medications    Does pt have all medications? Yes  []     No []           Diet        Is patient following prescribed diet and restrictions?  Yes []    No []      Scale/Weight    Does pt have a scale at home?  Yes []    No []     Did pt weight first thing this morning?  Yes []    No []      If yes, what was pt's first morning weight today?      Signs and Symptoms    Pt reports the following symptoms:       R radial access site    Pt verbalized understanding of signs and symptoms of HF and when to contact a provider or seek immediate assistance at the ER.    Was pt given zone sheet? Yes []   No []     Intervention(s)          When to get medical care  Call your healthcare provider right away if you have any of these:  Severe or increasing pain, numbness, coldness, or a bluish color in the leg or arm that held the catheter  Fever of 100.4? F ( 38?C) or higher, or as advised by your healthcare provider  Signs of infection at the incision site. These include redness, swelling, drainage, or warmth.  Bleeding, bruising, or a lot of swelling where the catheter was inserted  Blood in your urine  Black or tarry stools  Any unusual bleeding  Irregular, very slow, or fast heartbeat  Dizziness  Call 911  Call 911 if you have any of these:  Chest pain  Shortness of breath  Sudden numbness or weakness in arms, legs, or face, or trouble speaking  The puncture site swells up very fast  Bleeding from the puncture site that doesn't slow down with firm pressure    Plan of Care    Continued education needed for heart failure symptom management and when to contact our office.

## 2022-09-11 NOTE — Telephone Encounter
Patient Discharge Date from hospital: 09/10/22  Date Call Attempted: 09/11/22  Number of Attempts: 2  Date Call Completed:  09/11/22       Two Patient Identifier complete: Yes [x]      Next Appointment     Next follow-up appointment on 09/16/21 at 1300 with Fara Boros, APRN was canceled by patient.  Next OV is on 09/29/2022 at 0900 with Dr. Geronimo Boot in our [place] clinic. Pt states she has several upcoming appointments. She has an appt on 09/14/22 with her dentist and PCP. She is also not wanting to drive to [facility] in the snow. Pt also said Fara Boros wanted her cancel OV because she did not want her drive to [facility] and wanted to see her in March.      Transportation     Does pt have transportation?  Yes []     No [x]    NA []                  Home Health      No     Medications     Does pt have all medications? Yes  [x]     No []          START taking:  metoprolol tartrate (LOPRESSOR)  potassium chloride (K-TAB)  CHANGE how you take:  furosemide (LASIX)  STOP taking:  labetaloL 100 mg tablet (NORMODYNE)     Diet      200 mg cholesterol, 2 G Na     Is patient following prescribed diet and restrictions?  Yes [x]    No []       Scale/Weight     Does pt have a scale at home?  Yes [x]    No []      Did pt weight first thing this morning?  Yes [x]    No []       If yes, what was pt's first morning weight today?  232.1     Signs and Symptoms     Pt reports the following symptoms:      No BLE edema or upper abdominal bloating/chest tightness. States she has no SOA at rest. She is still having some DOE. Pt described symptoms of PND at night. States she wakes up with anxiety/panic attacks and can't breathe. She confirmed she sleeps with a wedge and a pillow. She did call her PCP to try to get a sleep aid. Her PCP is not in the office today and she will not see him until on Monday.       R radial access site OTC and she is still wearing her brace. No bleeding, drainage, redness, swelling, or pain to site.     Pt verbalized understanding of signs and symptoms of HF and when to contact a provider or seek immediate assistance at the ER.     Was pt given zone sheet? Yes [x]   No []      Intervention(s)     Advised pt use her wedge and pillow when she is sleeping to avoid PND. We will send an email to Dr. Meade Maw to inform her of continued anxiety, PND at night and unable to get a prescription for a sleep aid from her PCP until Monday. Advised pt we will forward her OV concern to Fara Boros, APRN.     Pt educated on the importance of weighing daily first thing in the morning before dressing, before eating or drinking, and after voiding using the same scale in the same location and write results down in note  pad or log. Notify us for weight gains of 3 lbs in one day or 5 lbs in one week. Notify your provider for increased SOA.  Notify your provider for swelling or increased swelling in BLE or abdominal fullness/bloating. Advised to check B/P at least once daily. Check 1-2 hours after am meds. Log results. Check B/P other times if feeling lightheaded, dizzy, or if you feel your heart rate is elevated. Document the time you checked and any symptoms you may be feeling at the time. Call 911 for sudden, severe chest/pain pressure/SOA develops. Be sure to keep your follow up appointment and bring your weight logs, B/P logs, and medication list with you to your appointment. Call us at [phone] if you have any questions.          When to get medical care  Call your healthcare provider right away if you have any of these:  Severe or increasing pain, numbness, coldness, or a bluish color in the leg or arm that held the catheter  Fever of 100.4? F ( 38?C) or higher, or as advised by your healthcare provider  Signs of infection at the incision site. These include redness, swelling, drainage, or warmth.  Bleeding, bruising, or a lot of swelling where the catheter was inserted  Blood in your urine  Black or tarry stools  Any unusual bleeding  Irregular, very slow, or fast heartbeat  Dizziness  Call 911  Call 911 if you have any of these:  Chest pain  Shortness of breath  Sudden numbness or weakness in arms, legs, or face, or trouble speaking  The puncture site swells up very fast  Bleeding from the puncture site that doesn't slow down with firm pressure     Plan of Care     Continued education needed for heart failure symptom management and when to contact our office.

## 2022-09-15 ENCOUNTER — Encounter: Admit: 2022-09-15 | Discharge: 2022-09-15 | Payer: MEDICARE

## 2022-09-15 ENCOUNTER — Ambulatory Visit: Admit: 2022-09-15 | Discharge: 2022-09-16 | Payer: MEDICARE

## 2022-09-15 DIAGNOSIS — I10 Essential (primary) hypertension: Secondary | ICD-10-CM

## 2022-09-15 NOTE — Progress Notes
Date of Service: 09/15/2022    Suzanne Hopkins is a 72 y.o. female.   Patient was identified using dual identification of name and date of birth. She was a new patient for me today.     HPI       Hospital discharge follow-up  Gean Stefanowicz  is a 72 y.o.  female  followed by Dr. Geronimo Boot in [place]  She presents for a post-hospitalization follow up today.    Her discharge summary was reviewed, and a thorough medication reconciliation was undertaken.    Reviewed hospital course with patient. Reviewed follow-up plan with patient. Reviewed current symptoms with patient.     Date of admission: 09/07/22   Date of discharge: 09/10/22   Date of Transition of Care Phone Call: 09/11/22     A/P as below:   Ms. Suzanne Hopkins is a 71yoF with HTN, HLD, and history of stroke who was transferred from an outside hospital on 09/07/22 for workup of ongoing chest discomfort and dyspnea.  She is admitted with acute decompensated heart failure after reporting gaining 12 pounds in two days. She underwent aggressive diuresis with improvement in her symptoms.  Echocardiogram performed 09/08/22 with technical difficulties but LV size normal, wall thickness increased, hyperdynamic systolic function with LVEF 70%; RV not well seen; normal biatrial size; no doppler evidence of significant valvular disease. She underwent stress test on 09/08/22 which was equivocal study due to technical challenges; probably low risk overall; medium sized mild to moderate intensity, mixed defect involving the entire inferior wall from base to apex.  Given this inconclusive workup, cardiology was consulted and she underwent left heart catheterization on 09/09/22 which showed no significant lesions.  She was transitioned to an oral diuretic regimen and close follow up was arranged.  She was medically stable for discharge home on 09/10/22.       She was last seen by Dr. Geronimo Boot  on 06/25/20 at which time he planned to see her back in a year and that never happened. She has an invalid husband at home that was needing a liver transplant at that time.     Pt presents today reporting that her weight has come down 0.4 pounds since she was discharged. She saw her PCP Dr Alona Bene yesterday and he said she had a little swelling still and that her kidney function and potassium were fine (labs drawn in clinic yesterday). She is still SOB the longer she is up and states that she has had to sleep up in a recliner for the last two weeks. She says this is not any better after having IV diuresis in the hospital, although she later stated that this may be anxiety related because it was so scary to feel like she could not breathe lying down that she is afraid to even try again. She is diuresing well (urinating frequently) on lasix 40 mg BID and her PCP did not change her dose yesterday. She denies CP, palpitations, dizziness, syncope, falls, or bleeding. She is already scheduled to see Dr Geronimo Boot in [place] in a few weeks.         Vitals:    09/15/22 1324   O2 Device: None (Room air)     There is no height or weight on file to calculate BMI.   Wt Readings from Last 3 Encounters:   09/09/22 106.5 kg (234 lb 12.8 oz)   06/25/20 100.4 kg (221 lb 6.4 oz)   04/04/19 90.3 kg (199 lb)  Past Medical History  Patient Active Problem List    Diagnosis Date Noted    Class 3 severe obesity in adult [facility]) 09/10/2022    Class 3 severe obesity with body mass index (BMI) of 40.0 to 44.9 in adult [facility]) 10/24/2015    Essential hypertension 10/24/2015    History of tobacco abuse 07/09/2015    Cardiovascular risk factor 07/09/2015    Family history of ASCVD (arteriosclerotic cardiovascular disease) 07/09/2015    Cerebral infarct (HCC) 07/01/2015     Hemiplegia, unspecified affecting left nondominate side, treated with tPA        Aphasia 07/01/2015    Hyperlipidemia 07/01/2015    CVA (cerebral vascular accident) (HCC) 07/01/2015     Acute ischemic right MCA stroke, MRI 03/2015 no acute infarct, mild chronic small vessel ischemic disease,         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 Echo doppler 03/2015 EF 75 % no valvular abnormalities, CXR 03/2015 normal cardiac size, low lung volume, bibasilar heterogeneous airspace opacities correlate ateleasis/and/or edema.       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  Skin: pink, no jaundice, exposed skin is intact  Eyes: conjunctivae and lids normal, pupils are equal and round   Lips: no pallor or cyanosis   Neck Veins:    Without obvious JVD  Respiratory Effort: breathing comfortably, no respiratory distress   Lower extremities: no obvious swelling  Orientation: oriented to time, place and person   PSYCH: Appropriate   Language and Memory: patient responsive and seems to comprehend information   NEURO: Alert and conversant       Cardiovascular Studies  Echo 09/08/22   Technically difficult study    The left ventricular size is normal. Wall thickness is increased. Concentric remodeling. The left ventricular systolic function is hyperdynamic. The visually estimated ejection fraction is 70%. There are no segmental wall motion abnormalities.    Right Ventricle: Ventricle not well seen. The right ventricle is probably normal in size. The right ventricular systolic function is probably normal.    Normal biatrial size.    No Doppler evidence of significant valvular disease.    The pulmonary artery pressure could not be estimated due to inadequate tricuspid regurgitation signal.    No pericardial effusion.    Compared with study dated 2006, no significant change is noted.    LHC 09/09/22  Mild non-obstructive coronary artery disease involving the LAD, Left circumflex and RCA.  Normal left ventricular end-diastolic pressure.  No significant gradient across the aortic valve on pullback.    Problems Addressed Today  Encounter Diagnoses   Name Primary?    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 I would diurese until her creatinine bumps and then back off. Consider SGLT2-I and MRA to augment diuresis. Follow up with PCP and/or Dr Geronimo Boot in person  Lab Results   Component Value Date    NTPROBNP 384.0 (H) 09/07/2022           GDMT Current Dose Changes made at visit   Aldosterone antagonist Has never started      SGLT2i Has never started     Diuretics Lasix 40 mg BID          Renal Surveillance:  Lab Results   Component Value Date    CR 0.91 09/10/2022      - repeat BMP at PCP office apparently WNL per patient    Hypertension  BP today 134/66  Currently on  losartan 25 mg daily and metoprolol 50 mg BID    CAD  S/p LHC 09/09/22 with Non-obstructive CAD  Cont with asa and atorvastatin  > f/u with Dr Geronimo Boot in [place]    Diabetic screening  >  Lab Results   Component Value Date/Time    HGBA1C 5.7 09/07/2022 04:04 AM    HGBA1C 5.2 04/05/2015 12:00 AM        Dyslipidemia  > cont on atorvastatin 40 mg QHS  Lab Results   Component Value Date/Time    CHOL 158 09/07/2022 04:04 AM    TRIG 113 09/07/2022 04:04 AM    HDL 83 09/07/2022 04:04 AM    LDL 52 09/07/2022 04:04 AM    VLDL 23 09/07/2022 04:04 AM    NONHDLCHOL 75 09/07/2022 04:04 AM    CHOLHDLC 3 01/02/2020 12:00 AM               Current Medications (including today's revisions)   acetaminophen (TYLENOL) 325 mg tablet Take two tablets by mouth every 6 hours as needed for Pain.    aspirin EC 81 mg tablet Take one tablet by mouth daily.    atorvastatin (LIPITOR) 40 mg tablet Take one tablet by mouth daily.    furosemide (LASIX) 40 mg tablet Take one tablet by mouth twice daily.    hydrOXYzine HCL (ATARAX) 25 mg tablet Take one tablet by mouth nightly as needed - may repeat once if needed for Anxiety.    losartan (COZAAR) 25 mg tablet Take one tablet by mouth daily.    metoprolol tartrate (LOPRESSOR) 50 mg tablet Take one tablet by mouth twice daily.    potassium chloride (K-TAB) 20 mEq tablet Take two tablets by mouth daily with food.            Total Time Today was 45 minutes in the following activities: Preparing to see the patient, Obtaining and/or reviewing separately obtained history, Performing a medically appropriate examination and/or evaluation, Counseling and educating the patient/family/caregiver, Ordering medications, tests, or procedures, and Documenting clinical information in the electronic or other health record     Sue Lush,  CHFN, APRN, NP-C  Advanced Heart Failure APP  [facility]  Collaborating physician Lyndel Safe, MD

## 2022-09-15 NOTE — Patient Instructions
Thank you for coming to [facility]. Your instructions today:    Med changes today:  none  Procedures planned: none  Please log your weight, blood pressure, and heart rate on the following page and bring to all visits.  Continue with 2 liter/day or 64 ounces/day fluid restriction and 2 gm (2000 mg) sodium restriction/day  Return to see Dr. Geronimo Boot  in 3-4 weeks as scheduled     Please call the office with any questions or concerns [phone] (nurse triage). Voicemail is available during regular business hours, calls before 4:00 pm should receive a call back the same day. After 4 pm and on weekends for emergencies you can reach the ON CALL provider at [phone]       To schedule or change an appointment call [phone].      It was a pleasure to see you in clinic today. Thank you for allowing [place] to be a part of your health care.     Sue Lush, CHFN, APRN-NP    [facility]  Advanced Heart Failure - Silver Team  Bing Matter, MD  Nurse Practitioners: Satira Anis, Fara Boros, Eyvonne Left, and Gates Rigg  RNs: Mick Sell, [place], Leafy Ro and Vivien Rota    At the [facility], you and your family are our top priority.  You may receive a survey via email or text message that we are asking you to complete.  We value your feedback to ensure you are satisfied with every visit and we are continually providing the highest quality of patient care.  We know your time is valuable and thank you in advance for completing the survey.    Lab and test results:  As a part of the CARES act, starting 12/07/2019, some results will be released to you via mychart immediately and automatically.  You may see results before your provider sees them; however, your provider will review all these results and then they, or one of their team, will notify you of result information and recommendations.   Critical results will be addressed immediately, but otherwise, please allow [place] time to get back with you prior to you reaching out to [place] for questions.  This will usually take about 72 hours for labs and 5-7 days for procedure test results.

## 2022-09-16 ENCOUNTER — Encounter: Admit: 2022-09-16 | Discharge: 2022-09-16 | Payer: MEDICARE

## 2022-09-23 ENCOUNTER — Encounter: Admit: 2022-09-23 | Discharge: 2022-09-23 | Payer: MEDICARE

## 2022-09-24 ENCOUNTER — Encounter: Admit: 2022-09-24 | Discharge: 2022-09-24 | Payer: MEDICARE

## 2022-10-20 ENCOUNTER — Encounter: Admit: 2022-10-20 | Discharge: 2022-10-20 | Payer: MEDICARE

## 2022-11-23 ENCOUNTER — Encounter: Admit: 2022-11-23 | Discharge: 2022-11-23 | Payer: MEDICARE

## 2022-11-26 ENCOUNTER — Encounter: Admit: 2022-11-26 | Discharge: 2022-11-26 | Payer: MEDICARE

## 2022-11-26 DIAGNOSIS — R519 Head ache: Secondary | ICD-10-CM

## 2022-11-26 DIAGNOSIS — J45909 Unspecified asthma, uncomplicated: Secondary | ICD-10-CM

## 2022-11-26 DIAGNOSIS — I639 Cerebral infarction, unspecified: Secondary | ICD-10-CM

## 2022-11-26 DIAGNOSIS — E785 Hyperlipidemia, unspecified: Secondary | ICD-10-CM

## 2022-11-26 DIAGNOSIS — F419 Anxiety disorder, unspecified: Secondary | ICD-10-CM

## 2022-11-26 DIAGNOSIS — Z8679 Personal history of other diseases of the circulatory system: Secondary | ICD-10-CM

## 2022-11-26 DIAGNOSIS — C4491 Basal cell carcinoma of skin, unspecified: Secondary | ICD-10-CM

## 2022-11-26 DIAGNOSIS — I219 Acute myocardial infarction, unspecified: Secondary | ICD-10-CM

## 2022-11-26 DIAGNOSIS — I10 Essential (primary) hypertension: Secondary | ICD-10-CM

## 2022-11-26 NOTE — Patient Instructions
Thank you for visiting our office today.    We would like to make the following medication adjustments:  NONE       Otherwise continue the same medications as you have been doing.          We will be pursuing the following tests after your appointment today:            We will plan to see you back in 6 months.  Please call us in the meantime with any questions or concerns.        Please allow 5-7 business days for our providers to review your results. All normal results will go to MyChart. If you do not have Mychart, it is strongly recommended to get this so you can easily view all your results. If you do not have mychart, we will attempt to call you once with normal lab and testing results. If we cannot reach you by phone with normal results, we will send you a letter.  If you have not heard the results of your testing after one week please give us a call.       Your Cardiovascular Medicine [place] Team (Steve, Lisa, Jamie, Melanie, and Dalante Minus)  phone number is [phone].

## 2023-02-06 IMAGING — MG MM mammogram 3D dx RT
4 series · 8 of 20 positions shown · non-contrast
Comparison: none

[Series 2: R CC · right · 5 of 6 slices shown]
[im 1/6]
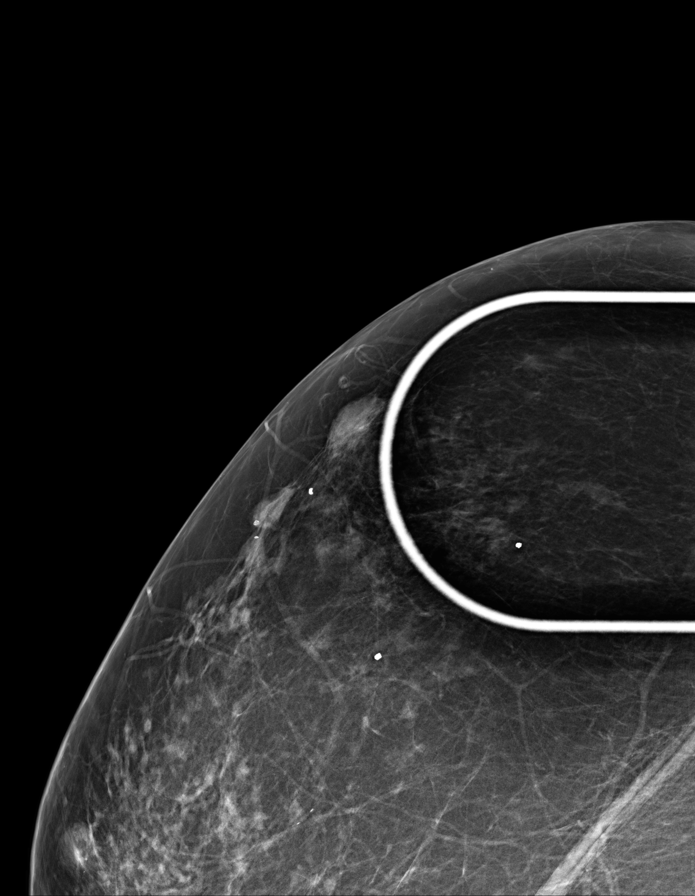
[im 2/6]
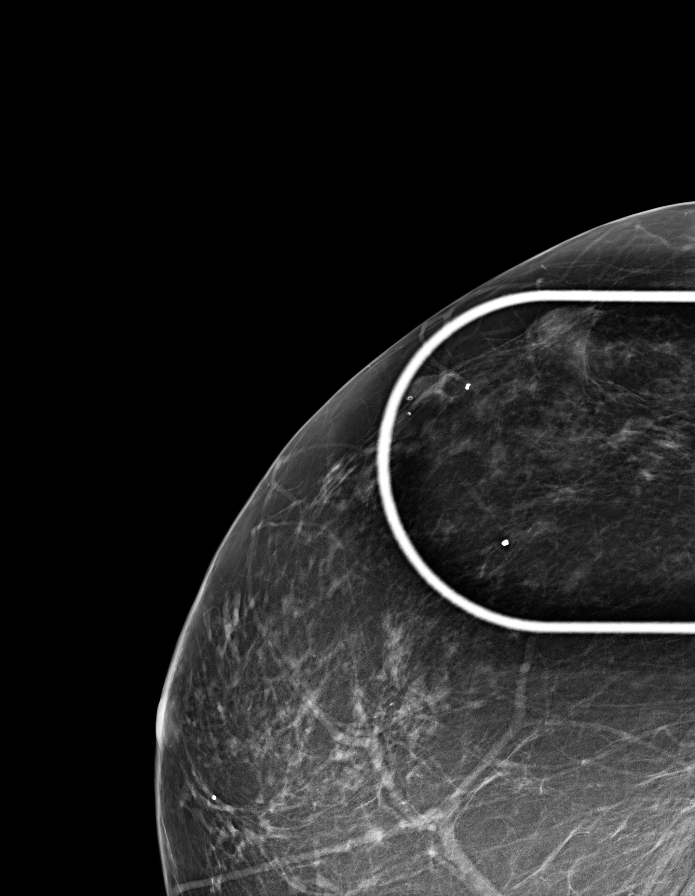
[im 3/6]
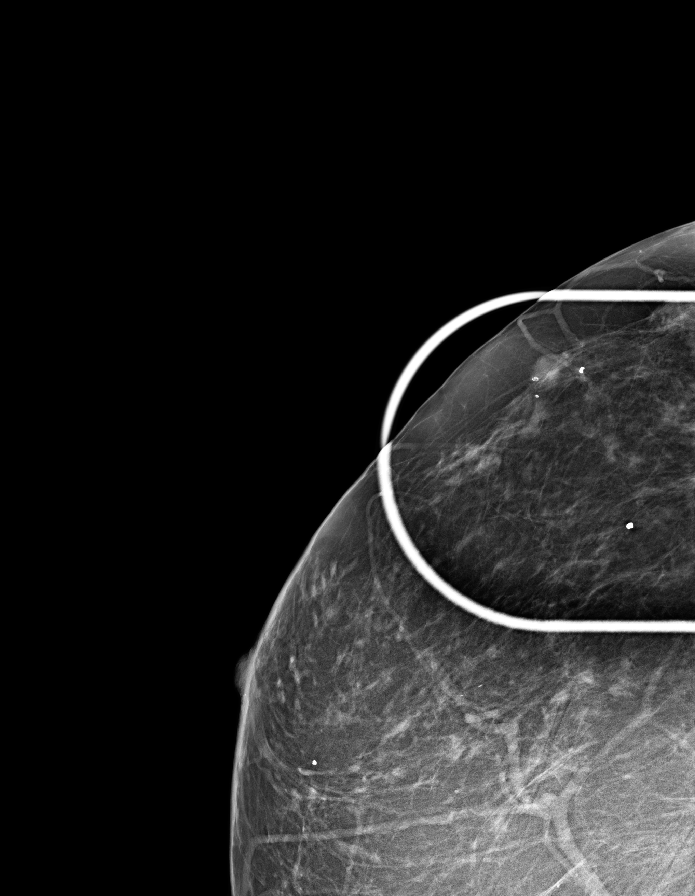
[im 4/6]
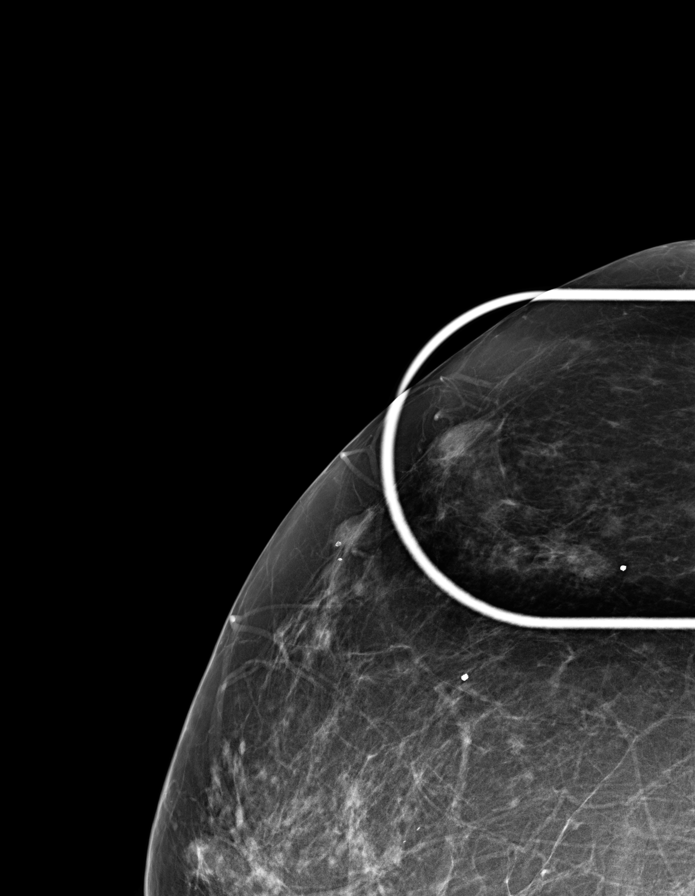
[im 6/6]
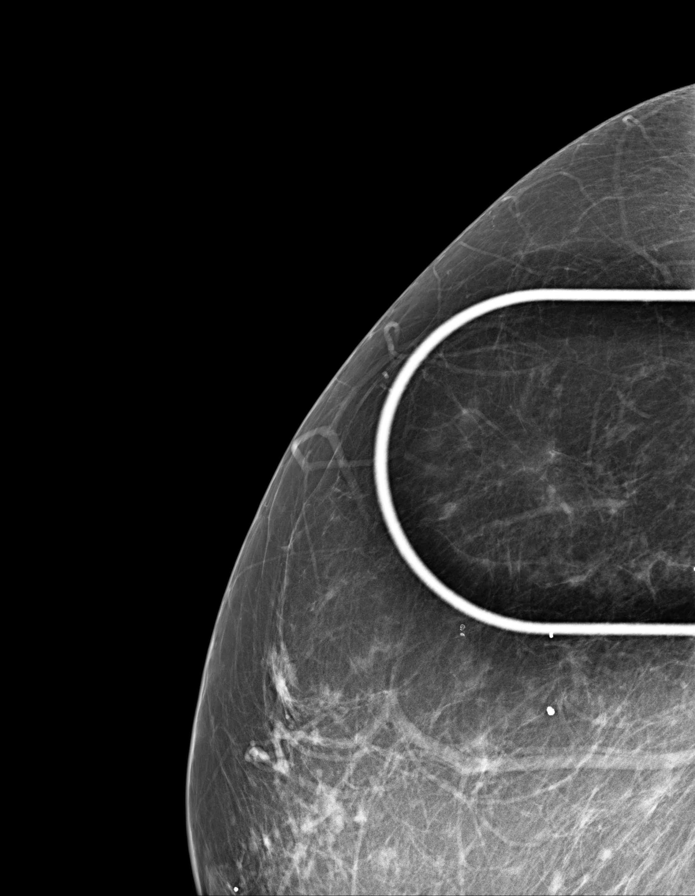

[BTO_TOMO R-CC, EMPIRE_C.97/112, Diagnosi tomo (1 of 2) · tomo slice 21/41.0]
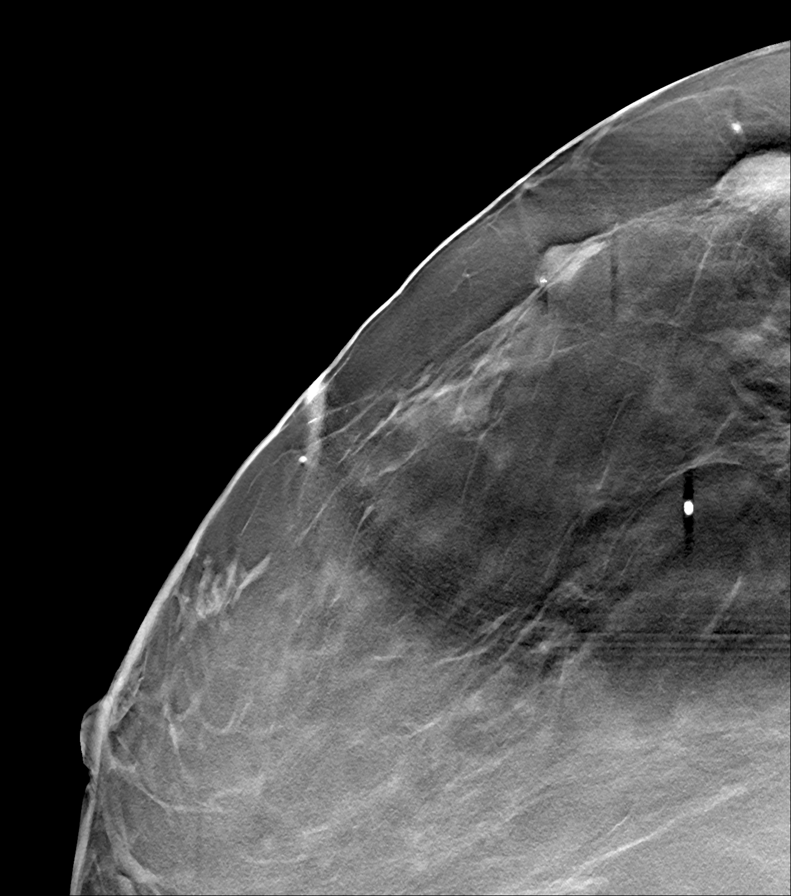

[BTO_TOMO R-CC, EMPIRE_C.97/112, Diagnosi tomo (2 of 2) · tomo slice 19/37.0]
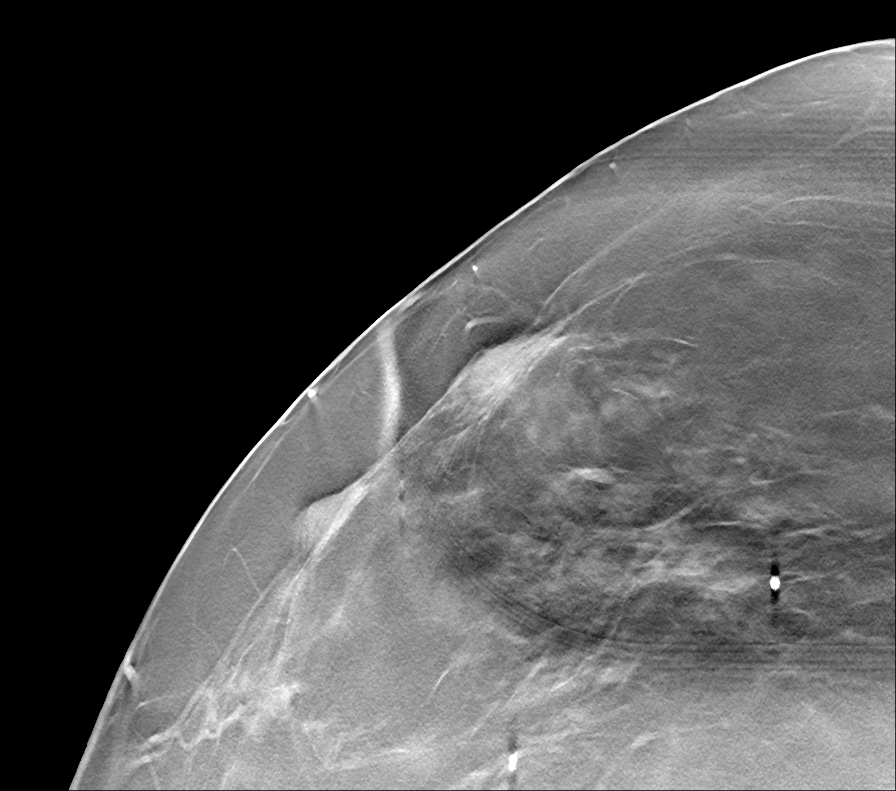

[BTO_TOMO R-MLO, EMPIRE_C.97/112, Diagnos tomo · tomo slice 23/46.0]
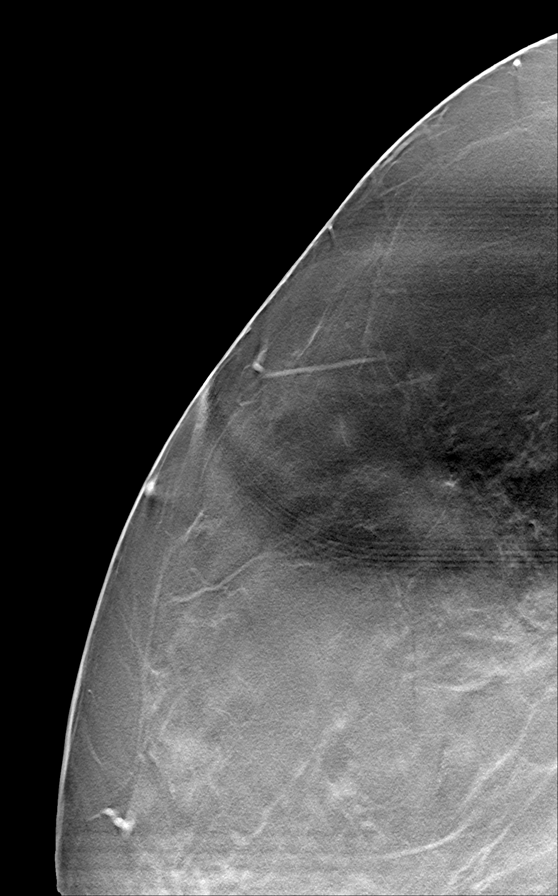

[8 of 20 positions shown; findings below may reference images not displayed]

EXAM

MM mammogram 3D dx RT

INDICATION

Abnormal right breast mammogram screening
F/U ABNORMAL MAMMO.  2 SISTERS DX OVARIAN CA AT AGE 40 45. TC SCORE: LIFETIME RISK: 4.0%.  DX RT. AB

TECHNIQUE

2D and tomosynthesis digital craniocaudal and mediolateral oblique views were obtained of both
breasts. Targeted US using grayscale and color doppler evaluation in the area of concern was
performed. Spectral analysis was also performed.

COMPARISONS

01/14/22

FINDINGS

Heterogenous breast tissue density, which may obscure small masses.

Persistent focal asymmetry in the right upper outer breast.

Upon targeted ultrasound, indeterminate oval-shaped area of hypoechogenicity which may represent
fibroglandular tissue, however given increasing conspicuity on mammogram and likely ultrasound
correlate, consider ultrasound-guided biopsy with clip placement to confirm benign nature.

IMPRESSION

BI-RADS 4, SUSPICIOUS.

A reminder letter will be sent.

Biopsy should be considered.

Tech Notes:

## 2023-02-14 IMAGING — MG MM mammogram, post proc
1 series · 3 of 3 positions shown · non-contrast
Comparison: none

[Series 2: R CC · right · 3 of 3 slices shown]
[im 1/3]
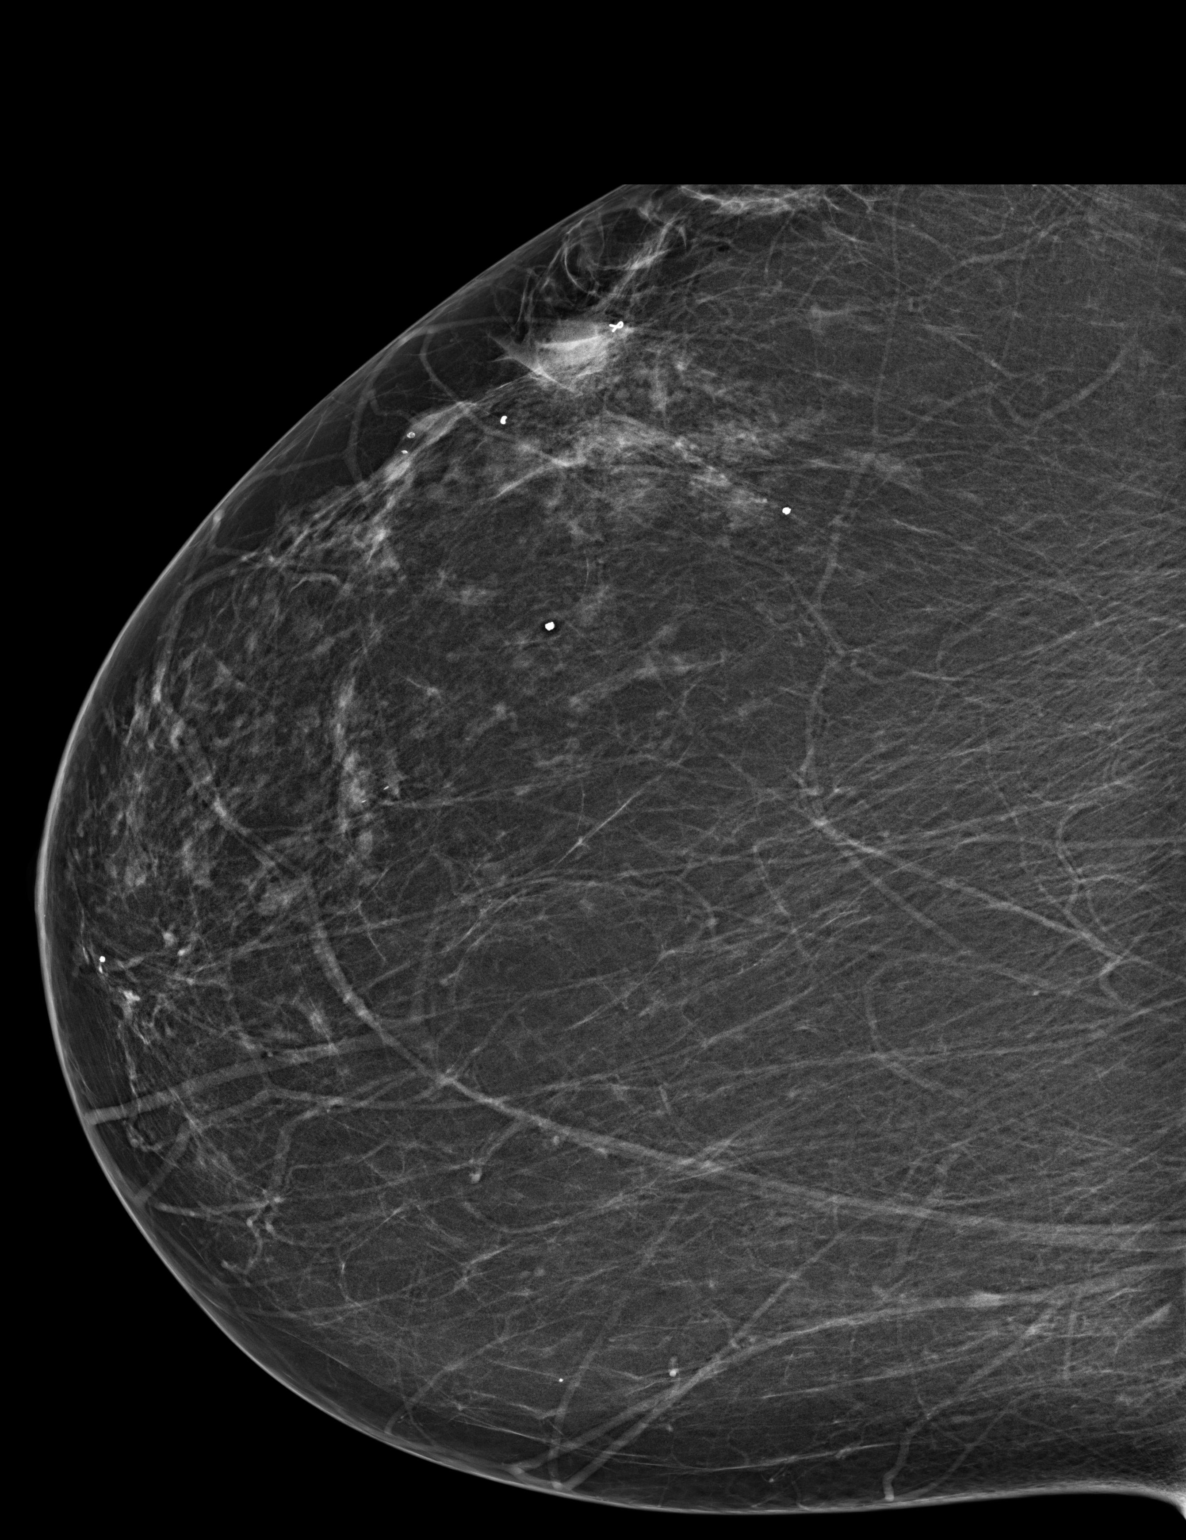
[im 2/3]
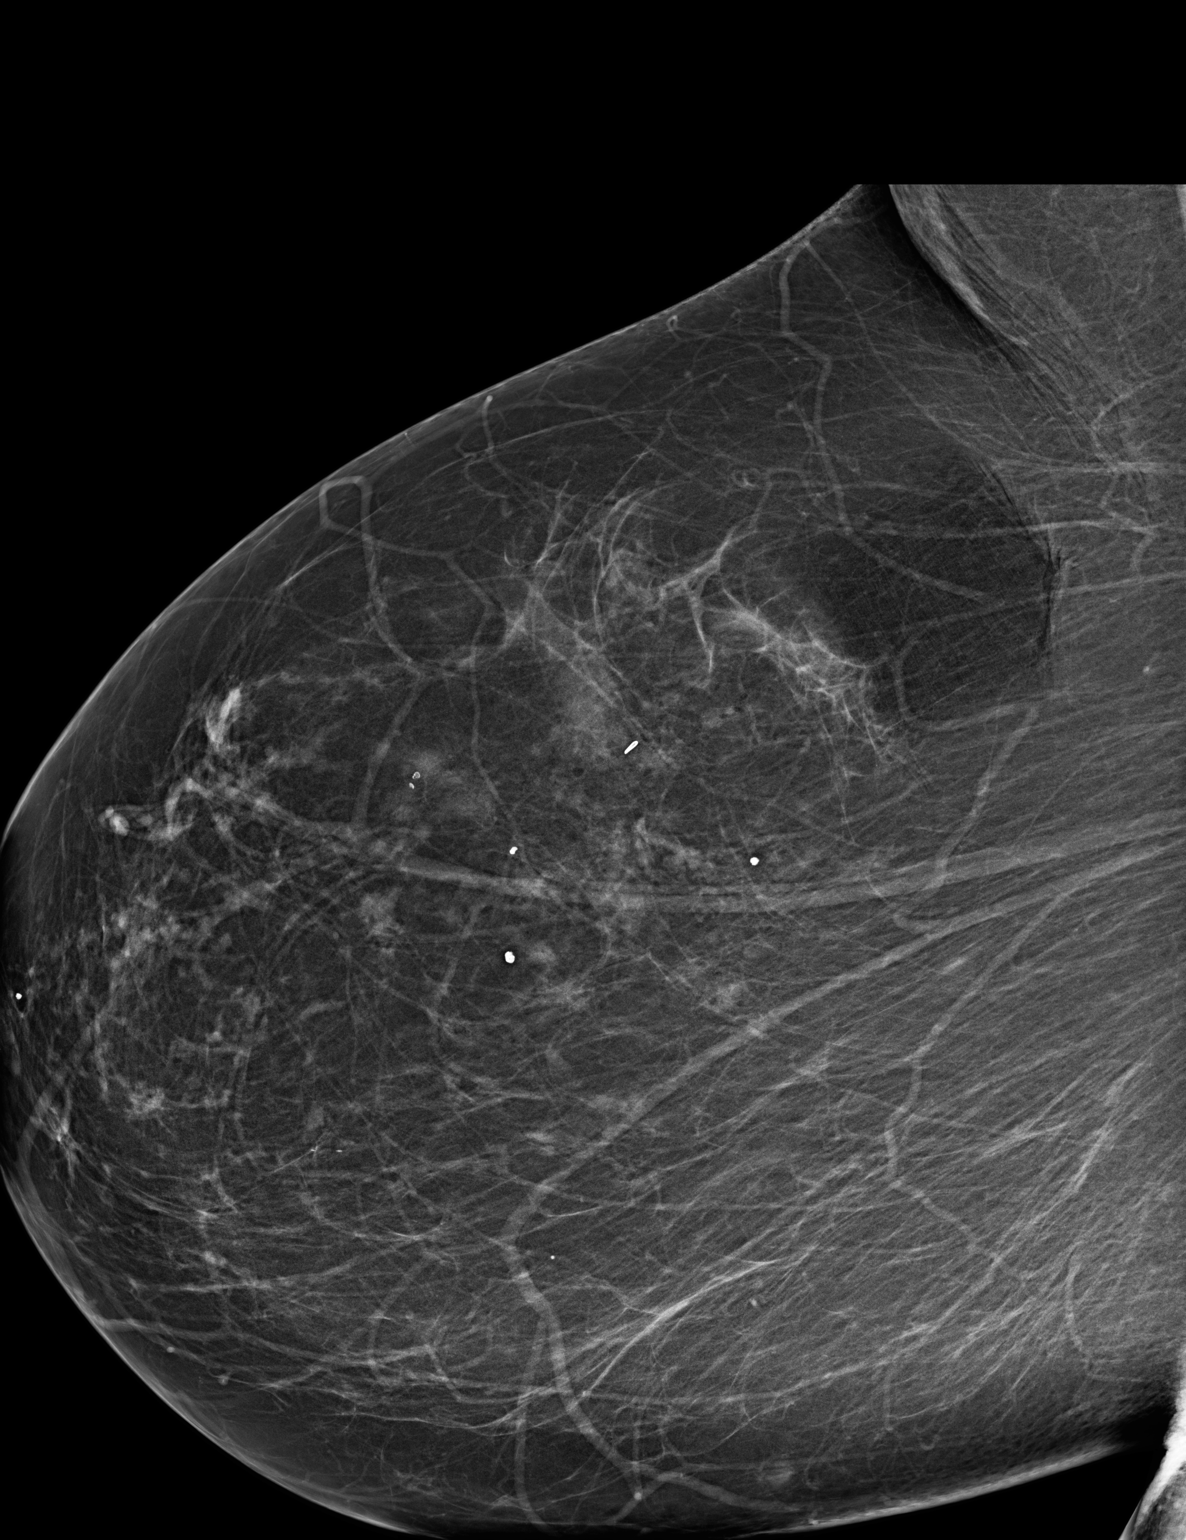
[im 3/3]
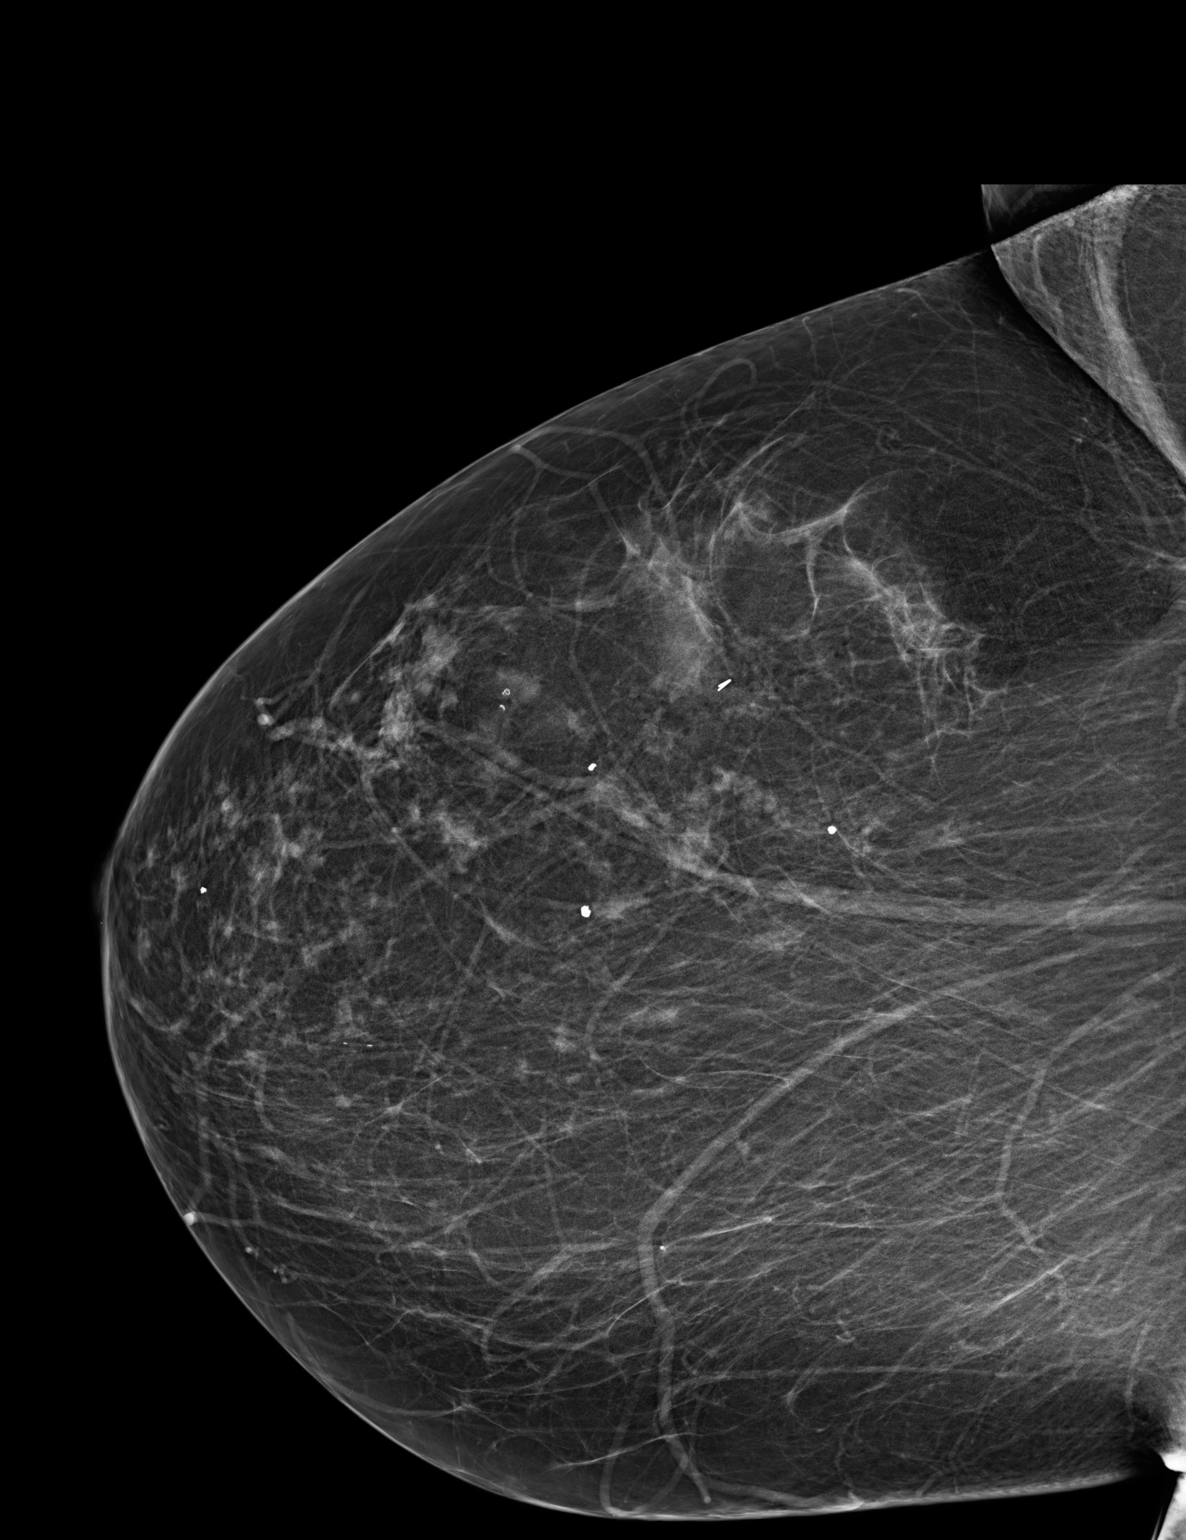

[3 of 3 positions shown; findings below may reference images not displayed]

EXAM

MM mammogram, post proc

INDICATION

Abnormal mammogram. Right breast.
post procedure imaging

TECHNIQUE

Craniocaudal and medial lateral projections of the right breast

COMPARISONS

01/14/22

FINDINGS

Successful clip deployment at the posterior margin of the area of asymmetry.

IMPRESSION

Successful clip placement.

Tech Notes:

## 2023-05-05 ENCOUNTER — Encounter: Admit: 2023-05-05 | Discharge: 2023-05-05 | Payer: MEDICARE

## 2023-05-11 ENCOUNTER — Encounter: Admit: 2023-05-11 | Discharge: 2023-05-11 | Payer: MEDICARE

## 2023-05-11 DIAGNOSIS — R519 Head ache: Secondary | ICD-10-CM

## 2023-05-11 DIAGNOSIS — F419 Anxiety disorder, unspecified: Secondary | ICD-10-CM

## 2023-05-11 DIAGNOSIS — Z8679 Personal history of other diseases of the circulatory system: Secondary | ICD-10-CM

## 2023-05-11 DIAGNOSIS — J45909 Unspecified asthma, uncomplicated: Secondary | ICD-10-CM

## 2023-05-11 DIAGNOSIS — E785 Hyperlipidemia, unspecified: Secondary | ICD-10-CM

## 2023-05-11 DIAGNOSIS — I219 Acute myocardial infarction, unspecified: Secondary | ICD-10-CM

## 2023-05-11 DIAGNOSIS — I10 Essential (primary) hypertension: Secondary | ICD-10-CM

## 2023-05-11 DIAGNOSIS — C4491 Basal cell carcinoma of skin, unspecified: Secondary | ICD-10-CM

## 2023-05-11 DIAGNOSIS — I639 Cerebral infarction, unspecified: Secondary | ICD-10-CM

## 2023-05-11 NOTE — Patient Instructions
Thank you for visiting our office today.    We would like to make the following medication adjustments:  NONE       Otherwise continue the same medications as you have been doing.          We will be pursuing the following tests after your appointment today:            We will plan to see you back in May.  Please call us in the meantime with any questions or concerns.        Please allow 5-7 business days for our providers to review your results. All normal results will go to MyChart. If you do not have Mychart, it is strongly recommended to get this so you can easily view all your results. If you do not have mychart, we will attempt to call you once with normal lab and testing results. If we cannot reach you by phone with normal results, we will send you a letter.  If you have not heard the results of your testing after one week please give [place] a call.       Your Cardiovascular Medicine [place] Team Brett Canales, Pilar Jarvis, Shawna Orleans, and [facility])  phone number is [phone].

## 2023-11-09 ENCOUNTER — Encounter: Admit: 2023-11-09 | Discharge: 2023-11-09 | Payer: MEDICARE

## 2023-11-09 ENCOUNTER — Ambulatory Visit: Admit: 2023-11-09 | Discharge: 2023-11-10 | Payer: MEDICARE

## 2023-11-09 DIAGNOSIS — Z9189 Other specified personal risk factors, not elsewhere classified: Secondary | ICD-10-CM

## 2023-11-09 DIAGNOSIS — I5030 Unspecified diastolic (congestive) heart failure: Secondary | ICD-10-CM

## 2023-11-09 DIAGNOSIS — I10 Essential (primary) hypertension: Secondary | ICD-10-CM

## 2023-11-09 DIAGNOSIS — I25118 Atherosclerotic heart disease of native coronary artery with other forms of angina pectoris: Secondary | ICD-10-CM

## 2023-11-09 DIAGNOSIS — E7800 Pure hypercholesterolemia, unspecified: Secondary | ICD-10-CM

## 2023-11-09 DIAGNOSIS — Z8249 Family history of ischemic heart disease and other diseases of the circulatory system: Secondary | ICD-10-CM

## 2023-11-09 DIAGNOSIS — Z136 Encounter for screening for cardiovascular disorders: Secondary | ICD-10-CM

## 2023-11-09 MED ORDER — SPIRONOLACTONE 25 MG PO TAB
25 mg | ORAL_TABLET | Freq: Every day | ORAL | 1 refills | 90.00000 days | Status: AC
Start: 2023-11-09 — End: ?

## 2023-11-09 NOTE — Progress Notes
 she underwent left heart catheterization on 09/09/22 which showed no significant lesions.  She was transitioned to an oral diuretic regimen and close follow up was arranged.  She was medically stable for discharge home on 09/10/22.                ECG: Normal sinus rhythm.            Assessment & Plan   73 y.o. female patient with the following medical problems:    Heart failure with preserved ejection fraction, NYHA class II.  Hypertension.  Hyperlipidemia.  History of cerebrovascular accident.  Volume overload.    Does appear mildly volume overloaded today, has lower extremity edema but reports that this is because she did not take her torsemide today due to clinic visit.  Reports good volume control when she does take her torsemide as planned, she knows to take an additional dose and call us if she continues to gain weight.  Guideline-directed medical therapy for heart failure with preserved ejection fraction: Continue beta-blocker, losartan.  Will add spironolactone today.  Consider addition of Jardiance at next visit based on clinical progression.  Have advised that she keep a blood pressure log to monitor her blood pressure.  Continue atorvastatin, LDL at goal in 2024.  Will repeat FLP annually and adjust lipid-lowering therapy as indicated.  Diet and lifestyle modification counseling provided.      Return to clinic in 6 months         Past Medical History  Patient Active Problem List    Diagnosis Date Noted    (HFpEF) heart failure with preserved ejection fraction (HCC) 09/23/2022     Per OV note 1/9/2024[name] NP  09/08/22 -  2D + DOPPLER ECHO: echnically difficult study.The left ventricular size is normal. Wall thickness is increased. Concentric remodeling. The left ventricular systolic function is hyperdynamic. The visually estimated ejection fraction is 70%. There are no segmental wall motion abnormalities.Right Ventricle: Ventricle not well seen. The right ventricle is probably normal in size. The right ventricular systolic function is probably normal. Normal biatrial size.No Doppler evidence of significant valvular disease.The pulmonary artery pressure could not be estimated due to inadequate tricuspid regurgitation signal.No pericardial effusion.Compared with study dated 2006, no significant change is noted.        CAD (coronary artery disease) 09/23/2022     Per Briant Cedar NP 09/15/2022  09/08/22 - Procedure: D-SPECT MULTI GATED MYOVIEW REGADENOSON MPI STRESS TEST : Left Ventricular Ejection Fraction  =  63 %. Left Ventricular End Diastolic Volume: 21.30 mL. Unfortunately this study is somewhat equivocal due to technical challenges.  It is probably low risk overall.  There is a medium sized, mild to moderate intensity, mixed defect involving the entire inferior wall from base to apex.  This is most prominent on stress and rest upright images.  It is still present on stress supine, but to a far lesser degree.  Suspect this is probably due to soft tissue attenuation, rather than a true perfusion abnormality.  There is normal regional wall motion in all segments.  Global LV systolic function is within normal limits to even slightly hyperdynamic.  There are no high risk prognostic indicators present.  The pharmacologic stress ECG was negative for ischemia.  There were rare isolated PVCs.  09/09/2022- Cardiac Cath: Mild non-obstructive coronary artery disease involving the LAD, Left circumflex and RCA. Normal left ventricular end-diastolic pressure.No significant gradient across the aortic valve on pullback.  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 (BMI) of 40.0 to 44.9 in adult [facility]) 10/24/2015    Essential hypertension 10/24/2015    History of tobacco abuse 07/09/2015    Cardiovascular risk factor 07/09/2015    Family history of ASCVD (arteriosclerotic cardiovascular disease) 07/09/2015    Cerebral infarct (HCC) 07/01/2015     Hemiplegia, unspecified 45.33 kg/m?Marland Kitchen     Physical Exam  General Appearance: no acute distress  HEENT: EOMI, mucous membranes moist, oropharynx is clear  Neck Veins: neck veins are flat & not distended  Carotid Arteries: no bruits  Chest Inspection: chest is normal in appearance  Auscultation/Percussion: lungs clear to auscultation, no rales, rhonchi, or wheezing  Cardiac Rhythm: regular rhythm & normal rate  Cardiac Auscultation: Normal S1 & S2, no S3 or S4, no rub  Murmurs: no cardiac murmurs  Abdominal Exam: soft, non-tender, normal bowel sounds, no masses or bruits  Abdominal aorta: nonpalpable   Liver & Spleen: no organomegaly  Extremities: 2+ pitting bilateral lower extremity edema; palpable distal pulses  Skin: warm & intact  Neurologic Exam: oriented to time, place and person; no focal neurologic deficits       Cardiovascular Studies  09/07/22   2D + DOPPLER ECHO   Result Value Ref Range    Left Ventricle Diastolic Volume 94.00 46 - 106 mL    Left Ventricle Systolic Volume 26.00 14 - 42 mL    IVS 1.10 0.6 - 0.9 cm    LVIDD 4.10 3.8 - 5.2 cm    LVIDS 3.00 2.2 - 3.5 cm    LVOT diameter 2.00 cm    LVOT peak VTI 18.90 cm    PW 1.10 0.6 - 0.9 cm    TDI lateral e' 0.07 m/s    TDI Medial e' 0.05 m/s    LA volume 53.10 22 - 52 mL    LA size 3.00 2.7 - 3.8 cm    AV peak velocity 1.50 m/s    Sinus 3.30 2.4 - 3.6 cm    E wave decelartion time 265.00 ms    MV Peak A Vel 0.75 m/s    MV Peak E Vel PW 0.49 m/s    Proximal aorta 3.60 1.9 - 3.5 cm    Right Heart Systolic Mmode TAPSE 2.28 >1.7 cm    Right Ventricular Mid Diameter 3.40 1.9 - 3.5 cm    Right Ventricular Basal Diameter 4.80 2.5 - 4.1 cm    Right Atrial Area 15.10 <18 cm2    Right Heart Systolic TDI S' 0.11 m/s    BSA 2.26 m2    FS 26.83 28 - 44 %    Teichholtz 45.61 %    Left Ventricle Systolic Volume Index 12 8 - 24 mL/m2    Left Ventricle Diastolic Volume Index 42 29 - 61 mL/m2    Left Atrium Index 23.50 16 - 34 mL/m2    LV mass 151 67 - 162 g    Left Ventricle Mass Index 67 43 - 95 g/m2    RWT 0.54 <=0.42    LVOT area 3.14 cm2    LVOT stroke volume 59.38 cm3    E/A ratio 0.65     Medial E/E' ratio 9.80     Lateral E/E' ratio 7.00     Cardiology Ultrasound Machine [employer] Epiq     RA PRESSURE 3     LVOT peak vel 0.8 m/s  AV Stroke Volume Index 28     AV index (native) 0.53     ECHO EF 70 %        Cardiovascular Health Factors  Vitals BP Readings from Last 3 Encounters:   11/09/23 (!) 164/87   05/11/23 129/82   11/26/22 (!) 152/93     Wt Readings from Last 3 Encounters:   11/09/23 117.9 kg (260 lb)   05/11/23 111.5 kg (245 lb 12.8 oz)   11/26/22 105.4 kg (232 lb 6.4 oz)     BMI Readings from Last 3 Encounters:   11/09/23 45.33 kg/m?   05/11/23 42.86 kg/m?   11/26/22 40.52 kg/m?      Smoking Social History     Tobacco Use   Smoking Status Every Day    Current packs/day: 0.50    Types: Cigarettes   Smokeless Tobacco Never      Lipid Profile Cholesterol   Date Value Ref Range Status   09/07/2022 158 <200 MG/DL Final     HDL   Date Value Ref Range Status   09/07/2022 83 >40 MG/DL Final     LDL   Date Value Ref Range Status   09/07/2022 52 <100 mg/dL Final     Triglycerides   Date Value Ref Range Status   09/07/2022 113 <150 MG/DL Final      Blood Sugar Hemoglobin A1C   Date Value Ref Range Status   09/07/2022 5.7 4.0 - 5.7 % Final     Comment:     The ADA recommends that most patients with type 1 and type 2 diabetes maintain   an A1c level <7%.       Glucose   Date Value Ref Range Status   10/30/2022 113 (H) 70 - 105 Final   09/23/2022 103  Final   09/18/2022 110 (H) 70 - 105 Final        ASCVD Risk Assessment:     ASCVD 10-year risk calculated: The ASCVD Risk score (Arnett DK, et al., 2019) failed to calculate for the following reasons:    Risk score cannot be calculated because patient has a medical history suggesting prior/existing ASCVD     LDL 70-189, if ASCVD 10-y risk is >7.5%, high to moderate-intensity statin therapy is recommended  Diabetes with ASCVD 10-y risk >7.5%, high-intensity

## 2023-11-15 LAB — COMPREHENSIVE METABOLIC PANEL
ALBUMIN: 4
ALK PHOSPHATASE: 74
ALT: 29
ANION GAP: 8
AST: 23
CALCIUM: 9.6
CHLORIDE: 106
CO2: 24
POTASSIUM: 4.7
SODIUM: 138
TOTAL BILIRUBIN: 0.6
TOTAL PROTEIN: 7.2

## 2023-11-15 LAB — LIPID PROFILE
HDL: 61 — ABNORMAL HIGH (ref 9.8–20.1)
LDL: 78
VLDL: 25 — ABNORMAL HIGH (ref 70–105)

## 2024-05-01 ENCOUNTER — Encounter: Admit: 2024-05-01 | Discharge: 2024-05-01 | Payer: MEDICARE

## 2024-05-01 DIAGNOSIS — I5030 Unspecified diastolic (congestive) heart failure: Secondary | ICD-10-CM

## 2024-05-01 DIAGNOSIS — I10 Essential (primary) hypertension: Secondary | ICD-10-CM

## 2024-05-01 DIAGNOSIS — Z136 Encounter for screening for cardiovascular disorders: Principal | ICD-10-CM

## 2024-05-01 DIAGNOSIS — E7800 Pure hypercholesterolemia, unspecified: Secondary | ICD-10-CM

## 2024-05-01 DIAGNOSIS — Z9189 Other specified personal risk factors, not elsewhere classified: Secondary | ICD-10-CM

## 2024-05-01 DIAGNOSIS — I25118 Atherosclerotic heart disease of native coronary artery with other forms of angina pectoris: Secondary | ICD-10-CM

## 2024-05-01 DIAGNOSIS — Z8249 Family history of ischemic heart disease and other diseases of the circulatory system: Secondary | ICD-10-CM

## 2024-05-16 ENCOUNTER — Encounter: Admit: 2024-05-16 | Discharge: 2024-05-16 | Payer: MEDICARE

## 2024-05-16 ENCOUNTER — Ambulatory Visit: Admit: 2024-05-16 | Discharge: 2024-05-16 | Payer: MEDICARE

## 2024-05-16 DIAGNOSIS — Z136 Encounter for screening for cardiovascular disorders: Principal | ICD-10-CM

## 2024-05-16 MED ORDER — SPIRONOLACTONE 25 MG PO TAB
25 mg | ORAL_TABLET | Freq: Every day | ORAL | 1 refills | 90.00000 days | Status: AC
Start: 2024-05-16 — End: ?

## 2024-05-16 MED ORDER — ATORVASTATIN 80 MG PO TAB
80 mg | ORAL_TABLET | Freq: Every day | ORAL | 3 refills | 90.00000 days | Status: AC
Start: 2024-05-16 — End: ?

## 2024-05-16 MED ORDER — METOPROLOL TARTRATE 50 MG PO TAB
50 mg | ORAL_TABLET | Freq: Two times a day (BID) | ORAL | 1 refills | 90.00000 days | Status: AC
Start: 2024-05-16 — End: ?

## 2024-05-16 MED ORDER — TORSEMIDE 20 MG PO TAB
20 mg | ORAL_TABLET | Freq: Every day | ORAL | 3 refills | 67.50000 days | Status: AC
Start: 2024-05-16 — End: ?

## 2024-05-16 MED ORDER — POTASSIUM CHLORIDE 20 MEQ PO TBER
40 meq | ORAL_TABLET | Freq: Every day | ORAL | 3 refills | 30.00000 days | Status: AC
Start: 2024-05-16 — End: ?

## 2024-05-16 MED ORDER — LOSARTAN 25 MG PO TAB
25 mg | ORAL_TABLET | Freq: Every day | ORAL | 3 refills | 90.00000 days | Status: AC
Start: 2024-05-16 — End: ?

## 2024-09-15 ENCOUNTER — Encounter: Admit: 2024-09-15 | Discharge: 2024-09-15 | Payer: MEDICARE
